# Patient Record
Sex: Female | Born: 1985 | Race: Black or African American | Hispanic: No | Marital: Single | Smoking: Former smoker
Health system: Southern US, Community
[De-identification: ages and names within clinical notes are randomized; demographics above are authoritative.]

## PROBLEM LIST (undated history)

## (undated) DIAGNOSIS — Z789 Other specified health status: Secondary | ICD-10-CM

---

## 2021-04-17 ENCOUNTER — Ambulatory Visit (HOSPITAL_COMMUNITY)
Admission: RE | Admit: 2021-04-17 | Discharge: 2021-04-17 | Disposition: A | Discharge status: Home / Self Care | Payer: PRIVATE HEALTH INSURANCE | Source: Ambulatory Visit

## 2021-04-17 ENCOUNTER — Encounter (HOSPITAL_COMMUNITY): Payer: Self-pay

## 2021-04-17 ENCOUNTER — Other Ambulatory Visit: Payer: Self-pay

## 2021-04-17 VITALS — BP 190/105 | HR 93 | Temp 99.0°F | Resp 18

## 2021-04-17 DIAGNOSIS — N63 Unspecified lump in unspecified breast: Secondary | ICD-10-CM

## 2021-04-17 NOTE — Discharge Instructions (Addendum)
Please follow up with breast center for imaging, they have been notified and should reach out to you, if you have not been called by Thursday please call them

## 2021-04-17 NOTE — ED Triage Notes (Signed)
Pt c/o a knot under rt nipple/breast area since Thursday. States the knot is getting larger and breast is sore. Denies any drainage.

## 2021-04-17 NOTE — ED Provider Notes (Addendum)
[facility]    CSN: [id number] Arrival date & time: 04/17/21  1749      History   Chief Complaint Chief Complaint  Patient presents with   Breast Pain   appt 6    HPI Ashley Bautista is a 35 y.o. female.   Patient presents with pain and hardness in right breast along the nipple beginning 4 days ago. Has worsened and feels "like its going to fall off". Has not occurred before. LMP around 5/25. Initially thought it was hormonal. No pain or discomfort in left breast. Denies discharge from nipple, cracking, dimpling or discoloration of the skin. Denies family history of breast cancer.   History reviewed. No pertinent past medical history.  There are no problems to display for this patient.   History reviewed. No pertinent surgical history.  OB History   No obstetric history on file.      Home Medications    Prior to Admission medications   Not on File    Family History History reviewed. No pertinent family history.  Social History Social History   Tobacco Use   Smoking status: Every Day    Pack years: 0.00    Types: Cigarettes   Smokeless tobacco: Never  Vaping Use   Vaping Use: Every day  Substance Use Topics   Alcohol use: Yes   Drug use: Yes    Types: Marijuana     Allergies   Patient has no known allergies.   Review of Systems Review of Systems Defer to HPI    Physical Exam Triage Vital Signs ED Triage Vitals [04/17/21 1822]  Enc Vitals Group     BP (!) 190/105     Pulse Rate 93     Resp 18     Temp 99 F (37.2 C)     Temp Source Oral     SpO2 98 %     Weight      Height      Head Circumference      Peak Flow      Pain Score 8     Pain Loc      Pain Edu?      Excl. in GC?    No data found.  Updated Vital Signs BP (!) 190/105 (BP Location: Left Arm)   Pulse 93   Temp 99 F (37.2 C) (Oral)   Resp 18   LMP 03/22/2021   SpO2 98%   Visual Acuity Right Eye Distance:   Left Eye Distance:   Bilateral Distance:     Right Eye Near:   Left Eye Near:    Bilateral Near:     Physical Exam Constitutional:      Appearance: Normal appearance. She is normal weight.  HENT:     Head: Normocephalic.  Eyes:     Extraocular Movements: Extraocular movements intact.  Pulmonary:     Effort: Pulmonary effort is normal.  Chest:  Breasts:    Right: Mass and tenderness present. No swelling, bleeding, inverted nipple, nipple discharge, skin change, axillary adenopathy or supraclavicular adenopathy.     Left: Normal.       Comments: Tenderness and non fluctuate mass felt between 4 to 5 o clock along right nipple  Musculoskeletal:     Cervical back: Normal range of motion.  Lymphadenopathy:     Upper Body:     Right upper body: No supraclavicular, axillary or pectoral adenopathy.  Neurological:     Mental Status: She is alert and oriented  to person, place, and time. Mental status is at baseline.  Psychiatric:        Mood and Affect: Mood normal.        Behavior: Behavior normal.     UC Treatments / Results  Labs (all labs ordered are listed, but only abnormal results are displayed) Labs Reviewed - No data to display  EKG   Radiology No results found.  Procedures Procedures (including critical care time)  Medications Ordered in UC Medications - No data to display  Initial Impression / Assessment and Plan / UC Course  I have reviewed the triage vital signs and the nursing notes.  Pertinent labs & imaging results that were available during my care of the patient were reviewed by me and considered in my medical decision making (see chart for details).  Nodule of skin of breast  1.referral placed for breast center for imaging, faxed over and given to patient. 2. [facility] [place] given to establish care  3. PCP referral placed Final Clinical Impressions(s) / UC Diagnoses   Final diagnoses:  Nodule of skin of breast     Discharge Instructions      Please follow  up with breast center for imaging, they have been notified and should reach out to you, if you have not been called by Thursday please call them   ED Prescriptions   None    PDMP not reviewed this encounter.   Valinda Hoar, NP 04/17/21 1858    Valinda Hoar, NP 04/17/21 [phone]

## 2021-04-20 ENCOUNTER — Other Ambulatory Visit: Payer: Self-pay | Admitting: Emergency Medicine

## 2021-04-20 DIAGNOSIS — N6310 Unspecified lump in the right breast, unspecified quadrant: Secondary | ICD-10-CM

## 2021-04-25 ENCOUNTER — Telehealth (HOSPITAL_BASED_OUTPATIENT_CLINIC_OR_DEPARTMENT_OTHER): Payer: Self-pay

## 2021-04-25 NOTE — Telephone Encounter (Signed)
LVMTCB re: scheduling a NP appt w/ PCP @ [facility]

## 2021-05-12 ENCOUNTER — Ambulatory Visit
Admission: RE | Admit: 2021-05-12 | Discharge: 2021-05-12 | Disposition: A | Discharge status: Home / Self Care | Payer: PRIVATE HEALTH INSURANCE | Source: Ambulatory Visit | Attending: Emergency Medicine | Admitting: Emergency Medicine

## 2021-05-12 ENCOUNTER — Other Ambulatory Visit: Payer: Self-pay | Admitting: Emergency Medicine

## 2021-05-12 ENCOUNTER — Other Ambulatory Visit: Payer: Self-pay

## 2021-05-12 DIAGNOSIS — N6310 Unspecified lump in the right breast, unspecified quadrant: Secondary | ICD-10-CM

## 2021-05-15 ENCOUNTER — Other Ambulatory Visit: Payer: Self-pay | Admitting: Emergency Medicine

## 2021-05-15 DIAGNOSIS — N611 Abscess of the breast and nipple: Secondary | ICD-10-CM

## 2021-05-15 LAB — AEROBIC/ANAEROBIC CULTURE W GRAM STAIN (SURGICAL/DEEP WOUND)
Culture: NORMAL
Special Requests: NORMAL

## 2021-05-31 ENCOUNTER — Ambulatory Visit
Admission: RE | Admit: 2021-05-31 | Discharge: 2021-05-31 | Disposition: A | Discharge status: Home / Self Care | Payer: PRIVATE HEALTH INSURANCE | Source: Ambulatory Visit | Attending: Emergency Medicine | Admitting: Emergency Medicine

## 2021-05-31 ENCOUNTER — Other Ambulatory Visit: Payer: Self-pay

## 2021-05-31 ENCOUNTER — Other Ambulatory Visit: Payer: Self-pay | Admitting: Emergency Medicine

## 2021-05-31 DIAGNOSIS — N611 Abscess of the breast and nipple: Secondary | ICD-10-CM

## 2021-06-14 ENCOUNTER — Ambulatory Visit
Admission: RE | Admit: 2021-06-14 | Discharge: 2021-06-14 | Disposition: A | Discharge status: Home / Self Care | Payer: PRIVATE HEALTH INSURANCE | Source: Ambulatory Visit | Attending: Emergency Medicine | Admitting: Emergency Medicine

## 2021-06-14 ENCOUNTER — Ambulatory Visit: Payer: PRIVATE HEALTH INSURANCE

## 2021-06-14 ENCOUNTER — Other Ambulatory Visit: Payer: Self-pay

## 2021-06-14 DIAGNOSIS — N611 Abscess of the breast and nipple: Secondary | ICD-10-CM

## 2021-08-28 ENCOUNTER — Telehealth: Payer: PRIVATE HEALTH INSURANCE

## 2021-08-31 ENCOUNTER — Encounter (HOSPITAL_COMMUNITY): Payer: Self-pay

## 2021-08-31 ENCOUNTER — Emergency Department (HOSPITAL_COMMUNITY)
Admission: EM | Admit: 2021-08-31 | Discharge: 2021-08-31 | Disposition: A | Discharge status: Home / Self Care | Payer: BC Managed Care – PPO | Attending: Emergency Medicine | Admitting: Emergency Medicine

## 2021-08-31 DIAGNOSIS — N61 Mastitis without abscess: Secondary | ICD-10-CM

## 2021-08-31 DIAGNOSIS — N644 Mastodynia: Secondary | ICD-10-CM | POA: Diagnosis present

## 2021-08-31 DIAGNOSIS — F17210 Nicotine dependence, cigarettes, uncomplicated: Secondary | ICD-10-CM | POA: Insufficient documentation

## 2021-08-31 DIAGNOSIS — N611 Abscess of the breast and nipple: Secondary | ICD-10-CM | POA: Diagnosis not present

## 2021-08-31 MED ORDER — OXYCODONE-ACETAMINOPHEN 5-325 MG PO TABS
1.0000 | ORAL_TABLET | Freq: Once | ORAL | Status: AC
  Administered 2021-08-31: 1 via ORAL
  Filled 2021-08-31: qty 1

## 2021-08-31 MED ORDER — LIDOCAINE-EPINEPHRINE-TETRACAINE (LET) TOPICAL GEL
3.0000 mL | Freq: Once | TOPICAL | Status: AC
  Administered 2021-08-31: 3 mL via TOPICAL
  Filled 2021-08-31: qty 3

## 2021-08-31 MED ORDER — LIDOCAINE HCL (PF) 1 % IJ SOLN
5.0000 mL | Freq: Once | INTRAMUSCULAR | Status: AC
  Administered 2021-08-31: 5 mL
  Filled 2021-08-31: qty 30

## 2021-08-31 MED ORDER — OXYCODONE-ACETAMINOPHEN 5-325 MG PO TABS
1.0000 | ORAL_TABLET | Freq: Three times a day (TID) | ORAL | 0 refills | Status: DC | PRN
Start: 2021-08-31 — End: 2021-10-09

## 2021-08-31 NOTE — ED Triage Notes (Signed)
Pt presents with c/o right breast swelling and pain. Pt's right breast is swollen and red. Pt reports a hx of same and reports she had to have the area drained. Pt reports the pain has been increasing and it is now causing shortness of breath and chest tightness.

## 2021-08-31 NOTE — Discharge Instructions (Addendum)
Continue taking antibiotics as prescribed.  Use warm compresses, 20 minutes at a time, 3 times a day. Use Tylenol or ibuprofen as needed for mild to moderate pain.  Use Percocet as needed for severe breakthrough pain.  Have caution, this make you tired or groggy.  Do not drive or operate heavy machinery while taking this medicine. Follow-up with the general surgery group for further management of your breast abscess.  Call the number listed below to set up a follow-up appointment for Monday afternoon.  Return with any new, worsening, concerning symptoms.

## 2021-08-31 NOTE — ED Notes (Signed)
Pt endorses pain when taking deep breaths. Pt states it feels like a "burning sensation."

## 2021-08-31 NOTE — ED Provider Notes (Signed)
[facility]-EMERGENCY DEPT Provider Note   CSN: [id number] Arrival date & time: 08/31/21  1154     History Chief Complaint  Patient presents with   Breast Pain   Shortness of Breath    Ashley Bautista is a 35 y.o. female presenting for evaluation of breast pain.  Patient states for the past 10 days she has had increasing pain of her right breast.  Initially it was red and hot, however in the past 2 days it has become much more swollen and painful.  She saw her PCP 2 days ago, was started on Bactrim.  She is been taking antibiotics as prescribed, but symptoms have been worsening.  She is been taking ibuprofen without improvement of symptoms.  She is not taking anything else for pain.  She had infection in the same spot of her breast a few months ago, drained at the breast center.  She called the birth center, but they were unable to get her an appointment for several months.  She denies fevers, chills, nausea or vomiting.  She has no other medical problems, takes no other medications daily.  HPI     History reviewed. No pertinent past medical history.  There are no problems to display for this patient.   History reviewed. No pertinent surgical history.   OB History   No obstetric history on file.     History reviewed. No pertinent family history.  Social History   Tobacco Use   Smoking status: Every Day    Types: Cigarettes   Smokeless tobacco: Never  Vaping Use   Vaping Use: Every day  Substance Use Topics   Alcohol use: Yes   Drug use: Yes    Types: Marijuana    Home Medications Prior to Admission medications   Not on File    Allergies    Patient has no known allergies.  Review of Systems   Review of Systems  Skin:        R breast pain, redness, swelling  All other systems reviewed and are negative.  Physical Exam Updated Vital Signs BP (!) 129/109   Pulse 69   Temp 98.3 F (36.8 C) (Oral)   Resp 16   Ht 5\' 6"  (1.676 m)   Wt  69.9 kg   SpO2 100%   BMI 24.86 kg/m   Physical Exam Vitals and nursing note reviewed. Exam conducted with a chaperone present.  Constitutional:      General: She is not in acute distress.    Appearance: Normal appearance.  HENT:     Head: Normocephalic and atraumatic.  Eyes:     Conjunctiva/sclera: Conjunctivae normal.     Pupils: Pupils are equal, round, and reactive to light.  Cardiovascular:     Rate and Rhythm: Normal rate and regular rhythm.     Pulses: Normal pulses.  Pulmonary:     Effort: Pulmonary effort is normal. No respiratory distress.     Breath sounds: Normal breath sounds. No wheezing.     Comments: Speaking in full sentences.  Clear lung sounds in all fields. Chest:       Comments: Blood fluctuant abscess with surrounding skin erythema and induration. No active drainage. No drainage from the nipple.  Abdominal:     General: There is no distension.     Palpations: Abdomen is soft.     Tenderness: There is no abdominal tenderness.  Musculoskeletal:        General: Normal range of motion.  Cervical back: Normal range of motion and neck supple.  Skin:    General: Skin is warm and dry.     Capillary Refill: Capillary refill takes less than 2 seconds.  Neurological:     Mental Status: She is alert and oriented to person, place, and time.  Psychiatric:        Mood and Affect: Mood and affect normal.        Speech: Speech normal.        Behavior: Behavior normal.    ED Results / Procedures / Treatments   Labs (all labs ordered are listed, but only abnormal results are displayed) Labs Reviewed - No data to display  EKG None  Radiology No results found.  Procedures .Marland KitchenIncision and Drainage  Date/Time: 08/31/2021 7:38 PM Performed by: Alveria Apley, PA-C Authorized by: Alveria Apley, PA-C   Consent:    Consent obtained:  Verbal   Consent given by:  Patient   Risks discussed:  Bleeding, incomplete drainage, pain and infection Location:     Type:  Abscess   Location:  Trunk   Trunk location:  R breast Pre-procedure details:    Skin preparation:  Antiseptic wash Anesthesia:    Anesthesia method:  Topical application and local infiltration   Topical anesthetic:  LET   Local anesthetic:  Lidocaine 1% w/o epi Procedure type:    Complexity:  Complex Procedure details:    Incision types:  Single straight   Incision depth:  Dermal   Wound management:  Probed and deloculated and irrigated with saline   Drainage:  Purulent   Drainage amount:  Copious   Packing materials:  1/4 in iodoform gauze Post-procedure details:    Procedure completion:  Tolerated well, no immediate complications   Medications Ordered in ED Medications  oxyCODONE-acetaminophen (PERCOCET/ROXICET) 5-325 MG per tablet 1 tablet (has no administration in time range)  lidocaine-EPINEPHrine-tetracaine (LET) topical gel (has no administration in time range)  lidocaine (PF) (XYLOCAINE) 1 % injection 5 mL (has no administration in time range)    ED Course  I have reviewed the triage vital signs and the nursing notes.  Pertinent labs & imaging results that were available during my care of the patient were reviewed by me and considered in my medical decision making (see chart for details).    MDM Rules/Calculators/A&P                           Patient presented for evaluation of breast pain.  On exam, patient appears uncomfortable due to pain, but otherwise nontoxic.  She has a large breast abscess with surrounding indurated cellulitic skin.  There is no active drainage.  Vital signs are reassuring, not consistent with sepsis.  Abscess is fairly superficial at this time.  Due to the amount of fluctuance and superficial nature, I do feel I&D is an option at this time.  However I discussed risks of poor healing and scarring.  Patient like to proceed with I&D.  I&D performed as described above.  Copious purulence expressed.  Packed with iodoform gauze.    Arrange follow-up with [facility] on Monday afternoon for packing removal and recheck.  Encourage patient continue taking antibiotics, she is on Bactrim.  Discussed wound care and use of warm compresses.  At this time, patient appears safe for discharge.  Return precautions given.  Patient states she understands and agrees to plan  Final Clinical Impression(s) / ED Diagnoses Final diagnoses:  None  Rx / DC Orders ED Discharge Orders     None        Alveria Apley, PA-C 08/31/21 1941    Pollyann Savoy, MD 08/31/21 [phone]

## 2021-10-03 ENCOUNTER — Ambulatory Visit (HOSPITAL_COMMUNITY)
Admission: EM | Admit: 2021-10-03 | Discharge: 2021-10-03 | Disposition: A | Discharge status: Other Acute Inpatient Hospital | Payer: BC Managed Care – PPO | Attending: Psychiatry | Admitting: Psychiatry

## 2021-10-03 ENCOUNTER — Other Ambulatory Visit: Payer: Self-pay | Admitting: Registered Nurse

## 2021-10-03 ENCOUNTER — Encounter (HOSPITAL_COMMUNITY): Payer: Self-pay | Admitting: Emergency Medicine

## 2021-10-03 DIAGNOSIS — F17210 Nicotine dependence, cigarettes, uncomplicated: Secondary | ICD-10-CM | POA: Insufficient documentation

## 2021-10-03 DIAGNOSIS — R45851 Suicidal ideations: Secondary | ICD-10-CM | POA: Diagnosis present

## 2021-10-03 DIAGNOSIS — F332 Major depressive disorder, recurrent severe without psychotic features: Secondary | ICD-10-CM | POA: Insufficient documentation

## 2021-10-03 DIAGNOSIS — Z20822 Contact with and (suspected) exposure to covid-19: Secondary | ICD-10-CM | POA: Diagnosis not present

## 2021-10-03 DIAGNOSIS — Z634 Disappearance and death of family member: Secondary | ICD-10-CM | POA: Diagnosis not present

## 2021-10-03 DIAGNOSIS — Z9151 Personal history of suicidal behavior: Secondary | ICD-10-CM | POA: Diagnosis not present

## 2021-10-03 LAB — ETHANOL: Alcohol, Ethyl (B): 217 mg/dL — ABNORMAL HIGH (ref <10)

## 2021-10-03 LAB — HIV ANTIBODY (ROUTINE TESTING W REFLEX): HIV Screen 4th Generation wRfx: NONREACTIVE

## 2021-10-03 LAB — POCT URINE DRUG SCREEN - MANUAL ENTRY (I-SCREEN)
POC Amphetamine UR: NOT DETECTED
POC Buprenorphine (BUP): NOT DETECTED
POC Cocaine UR: NOT DETECTED
POC Marijuana UR: POSITIVE — AB
POC Methadone UR: NOT DETECTED
POC Methamphetamine UR: NOT DETECTED
POC Morphine: NOT DETECTED
POC Oxazepam (BZO): NOT DETECTED
POC Oxycodone UR: NOT DETECTED
POC Secobarbital (BAR): NOT DETECTED

## 2021-10-03 LAB — URINALYSIS, COMPLETE (UACMP) WITH MICROSCOPIC
Bacteria, UA: NONE SEEN
Bilirubin Urine: NEGATIVE
Glucose, UA: NEGATIVE mg/dL
Hgb urine dipstick: NEGATIVE
Ketones, ur: 15 mg/dL — AB
Leukocytes,Ua: NEGATIVE
Nitrite: NEGATIVE
Protein, ur: NEGATIVE mg/dL
Specific Gravity, Urine: >1.03 — ABNORMAL HIGH (ref 1.005–1.030)
pH: 5.5 (ref 5.0–8.0)

## 2021-10-03 LAB — CBC WITH DIFFERENTIAL/PLATELET
Abs Immature Granulocytes: 0.04 10*3/uL (ref 0.00–0.07)
Basophils Absolute: 0 10*3/uL (ref 0.0–0.1)
Basophils Relative: 0 %
Eosinophils Absolute: 0 10*3/uL (ref 0.0–0.5)
Eosinophils Relative: 0 %
HCT: 44.7 % (ref 36.0–46.0)
Hemoglobin: 15.1 g/dL — ABNORMAL HIGH (ref 12.0–15.0)
Immature Granulocytes: 1 %
Lymphocytes Relative: 19 %
Lymphs Abs: 1.2 10*3/uL (ref 0.7–4.0)
MCH: 32.5 pg (ref 26.0–34.0)
MCHC: 33.8 g/dL (ref 30.0–36.0)
MCV: 96.1 fL (ref 80.0–100.0)
Monocytes Absolute: 0.7 10*3/uL (ref 0.1–1.0)
Monocytes Relative: 11 %
Neutro Abs: 4.5 10*3/uL (ref 1.7–7.7)
Neutrophils Relative %: 69 %
Platelets: 264 10*3/uL (ref 150–400)
RBC: 4.65 MIL/uL (ref 3.87–5.11)
RDW: 13.6 % (ref 11.5–15.5)
WBC: 6.4 10*3/uL (ref 4.0–10.5)
nRBC: 0 % (ref 0.0–0.2)

## 2021-10-03 LAB — COMPREHENSIVE METABOLIC PANEL
ALT: 24 U/L (ref 0–44)
AST: 48 U/L — ABNORMAL HIGH (ref 15–41)
Albumin: 3.9 g/dL (ref 3.5–5.0)
Alkaline Phosphatase: 41 U/L (ref 38–126)
Anion gap: 13 (ref 5–15)
BUN: 10 mg/dL (ref 6–20)
CO2: 20 mmol/L — ABNORMAL LOW (ref 22–32)
Calcium: 8.6 mg/dL — ABNORMAL LOW (ref 8.9–10.3)
Chloride: 105 mmol/L (ref 98–111)
Creatinine, Ser: 0.69 mg/dL (ref 0.44–1.00)
GFR, Estimated: >60 mL/min (ref >60)
Glucose, Bld: 98 mg/dL (ref 70–99)
Potassium: 3.5 mmol/L (ref 3.5–5.1)
Sodium: 138 mmol/L (ref 135–145)
Total Bilirubin: 0.9 mg/dL (ref 0.3–1.2)
Total Protein: 6.3 g/dL — ABNORMAL LOW (ref 6.5–8.1)

## 2021-10-03 LAB — POCT PREGNANCY, URINE: Preg Test, Ur: NEGATIVE

## 2021-10-03 LAB — LIPID PANEL
Cholesterol: 146 mg/dL (ref 0–200)
HDL: 92 mg/dL (ref >40)
LDL Cholesterol: 43 mg/dL (ref 0–99)
Total CHOL/HDL Ratio: 1.6 RATIO
Triglycerides: 53 mg/dL (ref <150)
VLDL: 11 mg/dL (ref 0–40)

## 2021-10-03 LAB — MAGNESIUM: Magnesium: 1.7 mg/dL (ref 1.7–2.4)

## 2021-10-03 LAB — HEMOGLOBIN A1C
Hgb A1c MFr Bld: 4.7 % — ABNORMAL LOW (ref 4.8–5.6)
Mean Plasma Glucose: 88.19 mg/dL

## 2021-10-03 LAB — RESP PANEL BY RT-PCR (FLU A&B, COVID) ARPGX2
Influenza A by PCR: NEGATIVE
Influenza B by PCR: NEGATIVE
SARS Coronavirus 2 by RT PCR: NEGATIVE

## 2021-10-03 LAB — POC SARS CORONAVIRUS 2 AG -  ED: SARS Coronavirus 2 Ag: NEGATIVE

## 2021-10-03 LAB — PREGNANCY, URINE: Preg Test, Ur: NEGATIVE

## 2021-10-03 MED ORDER — MAGNESIUM HYDROXIDE 400 MG/5ML PO SUSP: 30.0000 mL | Freq: Every day | ORAL | Status: DC | PRN

## 2021-10-03 MED ORDER — ACETAMINOPHEN 325 MG PO TABS: 650.0000 mg | ORAL_TABLET | Freq: Four times a day (QID) | ORAL | Status: DC | PRN

## 2021-10-03 MED ORDER — ALUM & MAG HYDROXIDE-SIMETH 200-200-20 MG/5ML PO SUSP: 30.0000 mL | ORAL | Status: DC | PRN

## 2021-10-03 MED ORDER — HYDROXYZINE HCL 25 MG PO TABS
25.0000 mg | ORAL_TABLET | Freq: Three times a day (TID) | ORAL | Status: DC | PRN
  Administered 2021-10-03: 25 mg via ORAL
  Filled 2021-10-03: qty 1

## 2021-10-03 MED ORDER — TRAZODONE HCL 50 MG PO TABS
50.0000 mg | ORAL_TABLET | Freq: Every evening | ORAL | Status: DC | PRN
  Administered 2021-10-03: 50 mg via ORAL
  Filled 2021-10-03: qty 1

## 2021-10-03 NOTE — ED Notes (Signed)
Pt sleeping@this time. Breathing even and unlabored. Will continue to monitor for safety 

## 2021-10-03 NOTE — ED Provider Notes (Signed)
[facility]/OBS ASAP Discharge Summary  Date and Time: 10/03/2021 9:03 PM  Name: Ashley Bautista  MRN:  [id number]   Discharge Diagnoses:  Final diagnoses:  Severe episode of recurrent major depressive disorder, without psychotic features (HCC)   Transferred to [facility] for inpatient psychiatric treatment  Admission HPI: Ashley Bautista, 35 y.o., female patient seen face to face by this provider, consulted with Dr. Bronwen Betters; and chart reviewed on 10/03/21.   Patient presented to [facility] as a walk in voluntarily  accompanied by her mother and sister.   On evaluation Ashley Bautista reports in April she moved with her partner of two years from [place] to [place] to be closer to her family. She was living with her partner, they had an argument yesterday and she has not seen her partner since. She has had depression and anxiety for while but states the past month she has began having suicidal ideations. She has been unable to work this week. She has been trying to cope on her own, but  today her Mother and sister was worried and brought her in for an assessment.She has no out patient services in place and does not take any medications.  Reports a psychiatric history of bipolar disorder, anxiety and depression.  She has a history of 1 suicide attempt by overdose with 1 inpatient psychiatric admission in [place] in 2012.  Patient also endorses drinking alcohol daily for the past 4 to 5 years.  She drinks 1/5 of brandy.  States she takes about 5 shots before she goes to work each day.  She endorses daily marijuana use (1 blunt).  Reports that she does have gaps with sobriety.  Denies any seizures or delirium tremors when withdrawing from alcohol.  Denies any withdrawal symptoms at this time.she is employed full-time as a Location manager.   During evaluation Ashley Bautista is in sitting position in no acute distress.  She makes fair eye contact.  She is fairly groomed.  She is alert/oriented x4 and cooperative.  She is  speaking at a moderate volume and normal pace.  Reports increased depression and anxiety for the past month.  Reports the anniversary of her father's death is in 08-Oct-2023 and his birthday is in the December.  States these months are extremely difficult for her.  Reports feelings of worthlessness, helplessness, and hopelessness.  Endorses endorses a decrease in energy and inability to focus.  Endorses broken sleep and a decrease in appetite.  She is unsure if she has had weight loss. She endorses racing thoughts about her relationship.  There is no indication that she is currently responding to internal/external stimuli or experiencing delusional thought content. She denies AVH and HI.  She denies delusion normal and paranoid thought.  She endorses suicidal ideations with a plan to overdose.  Patient cannot contract for safety she endorses passive homicidal ideations towards her partner.  Denies having any intent, plan, or access to means.   Discussed inpatient psychiatric treatment with patient.  Patient was initially hesitant.  Sister was allowed to come back to the assessment room .  After spending time with her sister patient agreed to the admission.   Collateral: Mother and sister Toni Amend) with patient's permission.  Mother states they have been concerned about patient for the past week.  States today she went to the patient's home the patient told her to take her animal.  Reports patient told her "I am ready to go".  Reports patient argued and demanded for her to leave so  she could take care of things.  Mother refused to leave and stay with patient today until patient agreed to come in for assessment.  Mother and sister believe patient is a danger to herself.  They believe patient will overdose if she is discharged at this time.  Past Medical History: No past medical history on file. No past surgical history on file. Family History: No family history on file.  Social History:  Social History    Substance and Sexual Activity  Alcohol Use Yes     Social History   Substance and Sexual Activity  Drug Use Yes   Types: Marijuana    Social History   Socioeconomic History   Marital status: Single    Spouse name: Not on file   Number of children: Not on file   Years of education: Not on file   Highest education level: Not on file  Occupational History   Not on file  Tobacco Use   Smoking status: Every Day    Types: Cigarettes   Smokeless tobacco: Never  Vaping Use   Vaping Use: Every day  Substance and Sexual Activity   Alcohol use: Yes   Drug use: Yes    Types: Marijuana   Sexual activity: Not on file  Other Topics Concern   Not on file  Social History Narrative   Not on file   Social Determinants of Health   Financial Resource Strain: Not on file  Food Insecurity: Not on file  Transportation Needs: Not on file  Physical Activity: Not on file  Stress: Not on file  Social Connections: Not on file   SDOH:  SDOH Screenings   Alcohol Screen: Not on file  Depression (PHQ2-9): Not on file  Financial Resource Strain: Not on file  Food Insecurity: Not on file  Housing: Not on file  Physical Activity: Not on file  Social Connections: Not on file  Stress: Not on file  Tobacco Use: High Risk   Smoking Tobacco Use: Every Day   Smokeless Tobacco Use: Never   Passive Exposure: Not on file  Transportation Needs: Not on file    Tobacco Cessation:  Prescription not provided because: transferred to inpatient facility  Current Medications:  Current Facility-Administered Medications  Medication Dose Route Frequency Provider Last Rate Last Admin   acetaminophen (TYLENOL) tablet 650 mg  650 mg Oral Q6H PRN Ardis Hughs, NP       alum & mag hydroxide-simeth (MAALOX/MYLANTA) 200-200-20 MG/5ML suspension 30 mL  30 mL Oral Q4H PRN Ardis Hughs, NP       hydrOXYzine (ATARAX) tablet 25 mg  25 mg Oral TID PRN Ardis Hughs, NP       magnesium hydroxide  (MILK OF MAGNESIA) suspension 30 mL  30 mL Oral Daily PRN Ardis Hughs, NP       traZODone (DESYREL) tablet 50 mg  50 mg Oral QHS PRN Ardis Hughs, NP       Current Outpatient Medications  Medication Sig Dispense Refill   ibuprofen (ADVIL) 200 MG tablet Take 200 mg by mouth every 6 (six) hours as needed for moderate pain, mild pain or headache.     oxyCODONE-acetaminophen (PERCOCET/ROXICET) 5-325 MG tablet Take 1 tablet by mouth every 8 (eight) hours as needed for severe pain. 8 tablet 0   sulfamethoxazole-trimethoprim (BACTRIM DS) 800-160 MG tablet Take 1 tablet by mouth 2 (two) times daily.      PTA Medications: (Not in a hospital admission)  Musculoskeletal  Strength & Muscle Tone: within normal limits Gait & Station: normal Patient leans: N/A  Psychiatric Specialty Exam  Presentation  General Appearance: Casual; Appropriate for Environment  Eye Contact:Fair  Speech:Clear and Coherent; Normal Rate  Speech Volume:Normal  Handedness:No data recorded  Mood and Affect  Mood:Anxious; Depressed  Affect:Tearful; Congruent; Depressed   Thought Process  Thought Processes:Coherent  Descriptions of Associations:Intact  Orientation:Full (Time, Place and Person)  Thought Content:Logical  Diagnosis of Schizophrenia or Schizoaffective disorder in past: No    Hallucinations:Hallucinations: None  Ideas of Reference:None  Suicidal Thoughts:Suicidal Thoughts: Yes, Active SI Active Intent and/or Plan: With Intent; With Plan; With Means to Carry Out  Homicidal Thoughts:Homicidal Thoughts: Yes, Passive HI Passive Intent and/or Plan: Without Intent; Without Plan; Without Means to Carry Out   Sensorium  Memory:Immediate Good; Recent Good; Remote Good  Judgment:Poor  Insight:Fair   Executive Functions  Concentration:Good  Attention Span:Good  Recall:Good  Fund of Knowledge:Good  Language:Good   Psychomotor Activity  Psychomotor Activity:Psychomotor  Activity: Normal   Assets  Assets:Communication Skills; Desire for Improvement; Financial Resources/Insurance; Housing; Leisure Time; Physical Health; Social Support; English as a second language teacher; Vocational/Educational   Sleep  Sleep:Sleep: Poor Number of Hours of Sleep: 2   Nutritional Assessment (For OBS and FBC admissions only) Has the patient had a weight loss or gain of 10 pounds or more in the last 3 months?: No Has the patient had a decrease in food intake/or appetite?: Yes Does the patient have dental problems?: No Does the patient have eating habits or behaviors that may be indicators of an eating disorder including binging or inducing vomiting?: No Has the patient recently lost weight without trying?: 0 Has the patient been eating poorly because of a decreased appetite?: 1 Malnutrition Screening Tool Score: 1    Physical Exam  Physical Exam Constitutional:      General: She is not in acute distress.    Appearance: She is not ill-appearing, toxic-appearing or diaphoretic.  Pulmonary:     Effort: Pulmonary effort is normal. No respiratory distress.  Musculoskeletal:        General: Normal range of motion.  Neurological:     Mental Status: She is alert and oriented to person, place, and time.  Psychiatric:        Mood and Affect: Mood is anxious and depressed.        Thought Content: Thought content is not paranoid or delusional. Thought content includes suicidal ideation. Thought content does not include homicidal ideation. Thought content includes suicidal plan.   Review of Systems  Respiratory:  Negative for cough and shortness of breath.   Cardiovascular:  Negative for chest pain.  Gastrointestinal:  Negative for diarrhea, nausea and vomiting.   Blood pressure 131/81, pulse 74, temperature 99 F (37.2 C), temperature source Oral, resp. rate 16, SpO2 100 %. There is no height or weight on file to calculate BMI.    Disposition: Transferred to [facility] for inpatient  psychiatric treatment  Jackelyn Poling, NP 10/03/2021, 9:03 PM

## 2021-10-03 NOTE — ED Notes (Signed)
Pt calm and cooperative. No c/o pain or distress. A&O x4. Will continue to monitor pt for safety

## 2021-10-03 NOTE — Discharge Instructions (Addendum)
Transfer patient to [facility] for inpatient psychiatric admission.

## 2021-10-03 NOTE — Progress Notes (Signed)
Pt was accepted to [facility] 10/03/2021 at 2300 PENDING Negative COVID-19; Bed Assignment 402-2.  Pt meets inpatient criteria per Fatima Sanger, NP  Attending Physician will be Dr. Sherron Flemings  Report can be called to: Adult unit: [phone]  Pt can arrive after 2300  Care team notified via secure chat: Jessie Foot, RN, Bedelia Person, RN, Rodney Langton, LPN, [facility] Rona Ravens, RN, Vernard Gambles, NP, and Hansel Starling, RN.  Voluntary can be sent to [phone].  Kelton Pillar, LCSWA 10/03/2021 @ 7:05 PM

## 2021-10-03 NOTE — ED Notes (Signed)
Pt awake and alert x4.  Flat sad affect. States that " this time of year is always hard for me.  My Father died the day after Christmas and I get depressed around this time".  Pt states that she had the thought of overdosing on " all the pills I have in the house like I did one time before".  Pt searched prior to admission to [facility]  Skin assessment performed by Ryta RN.  Pt oriented to Unit and given meal tray.  No further distress noted. Will monitor for safety.

## 2021-10-03 NOTE — BH Assessment (Signed)
Ashley Bautista SI with plan to OD, ETOH and THC use daily. Recent argument with partner and has not heard from her in 24 hours. Pt is emergent

## 2021-10-03 NOTE — ED Provider Notes (Signed)
Behavioral Health Admission H&P [facility] & OBS)  Date: 10/03/21 Patient Name: Ashley Bautista MRN: [id number] Chief Complaint:  Chief Complaint  Patient presents with   Suicidal      Diagnoses:  Final diagnoses:  Severe episode of recurrent major depressive disorder, without psychotic features [facility])    HPI: Ashley Bautista, 35 y.o., female patient seen face to face by this provider, consulted with Dr. Bronwen Betters; and chart reviewed on 10/03/21.   Patient presented to [facility] as a walk in voluntarily  accompanied by her mother and sister.  On evaluation Ashley Bautista reports in April she moved with her partner of two years from [place] to [place] to be closer to her family. She was living with her partner, they had an argument yesterday and she has not seen her partner since. She has had depression and anxiety for while but states the past month she has began having suicidal ideations. She has been unable to work this week. She has been trying to cope on her own, but  today her Mother and sister was worried and brought her in for an assessment.She has no out patient services in place and does not take any medications.  Reports a psychiatric history of bipolar disorder, anxiety and depression.  She has a history of 1 suicide attempt by overdose with 1 inpatient psychiatric admission in [place] in 2012.  Patient also endorses drinking alcohol daily for the past 4 to 5 years.  She drinks 1/5 of brandy.  States she takes about 5 shots before she goes to work each day.  She endorses daily marijuana use (1 blunt).  Reports that she does have gaps with sobriety.  Denies any seizures or delirium tremors when withdrawing from alcohol.  Denies any withdrawal symptoms at this time.she is employed full-time as a Location manager.  During evaluation Ashley Bautista is in sitting position in no acute distress.  She makes fair eye contact.  She is fairly groomed.  She is alert/oriented x4 and cooperative.  She is speaking  at a moderate volume and normal pace.  Reports increased depression and anxiety for the past month.  Reports the anniversary of her father's death is in Oct 02, 2023 and his birthday is in the December.  States these months are extremely difficult for her.  Reports feelings of worthlessness, helplessness, and hopelessness.  Endorses endorses a decrease in energy and inability to focus.  Endorses broken sleep and a decrease in appetite.  She is unsure if she has had weight loss. She endorses racing thoughts about her relationship.  There is no indication that she is currently responding to internal/external stimuli or experiencing delusional thought content. She denies AVH and HI.  She denies delusion normal and paranoid thought.  She endorses suicidal ideations with a plan to overdose.  Patient cannot contract for safety she endorses passive homicidal ideations towards her partner.  Denies having any intent, plan, or access to means.  Discussed inpatient psychiatric treatment with patient.  Patient was initially hesitant.  Sister was allowed to come back to the assessment room .  After spending time with her sister patient agreed to the admission.  Collateral: Mother and sister Ashley Bautista) with patient's permission.  Mother states they have been concerned about patient for the past week.  States today she went to the patient's home the patient told her to take her animal.  Reports patient told her "I am ready to go".  Reports patient argued and demanded for her to leave so she could  take care of things.  Mother refused to leave and stay with patient today until patient agreed to come in for assessment.  Mother and sister believe patient is a danger to herself.  They believe patient will overdose if she is discharged at this time.      PHQ 2-9:   Flowsheet Row ED from 10/03/2021 in [facility] ED from 08/31/2021 in [facility] DEPT ED from 04/17/2021 in [facility]  Health Urgent Care at [facility] RISK CATEGORY High Risk No Risk No Risk        Total Time spent with patient: 45 minutes  Musculoskeletal  Strength & Muscle Tone: within normal limits Gait & Station: normal Patient leans: N/A  Psychiatric Specialty Exam  Presentation General Appearance: Casual; Appropriate for Environment  Eye Contact:Fair  Speech:Clear and Coherent; Normal Rate  Speech Volume:Normal  Handedness:No data recorded  Mood and Affect  Mood:Anxious; Depressed  Affect:Tearful; Congruent; Depressed   Thought Process  Thought Processes:Coherent  Descriptions of Associations:Intact  Orientation:Full (Time, Place and Person)  Thought Content:Logical    Hallucinations:Hallucinations: None  Ideas of Reference:None  Suicidal Thoughts:Suicidal Thoughts: Yes, Active SI Active Intent and/or Plan: With Intent; With Plan; With Means to Carry Out  Homicidal Thoughts:Homicidal Thoughts: Yes, Passive HI Passive Intent and/or Plan: Without Intent; Without Plan; Without Means to Carry Out   Sensorium  Memory:Immediate Good; Recent Good; Remote Good  Judgment:Poor  Insight:Fair   Executive Functions  Concentration:Good  Attention Span:Good  Recall:Good  Fund of Knowledge:Good  Language:Good   Psychomotor Activity  Psychomotor Activity:Psychomotor Activity: Normal   Assets  Assets:Communication Skills; Desire for Improvement; Financial Resources/Insurance; Housing; Leisure Time; Physical Health; Social Support; English as a second language teacher; Vocational/Educational   Sleep  Sleep:Sleep: Poor Number of Hours of Sleep: 2   Nutritional Assessment (For OBS and FBC admissions only) Has the patient had a weight loss or gain of 10 pounds or more in the last 3 months?: No Has the patient had a decrease in food intake/or appetite?: Yes Does the patient have dental problems?: No Does the patient have eating habits or behaviors that may be indicators of an  eating disorder including binging or inducing vomiting?: No Has the patient recently lost weight without trying?: 0 Has the patient been eating poorly because of a decreased appetite?: 1 Malnutrition Screening Tool Score: 1   Physical Exam Vitals and nursing note reviewed.  Constitutional:      General: She is not in acute distress.    Appearance: Normal appearance. She is not ill-appearing.  HENT:     Head: Normocephalic.  Eyes:     General:        Right eye: No discharge.        Left eye: No discharge.     Conjunctiva/sclera: Conjunctivae normal.  Cardiovascular:     Rate and Rhythm: Normal rate.  Pulmonary:     Effort: Pulmonary effort is normal.  Musculoskeletal:        General: Normal range of motion.     Cervical back: Normal range of motion.  Skin:    General: Skin is warm.     Coloration: Skin is not jaundiced or pale.  Neurological:     Mental Status: She is alert and oriented to person, place, and time.  Psychiatric:        Attention and Perception: Attention and perception normal.        Mood and Affect: Mood is anxious and depressed.  Speech: Speech normal.        Behavior: Behavior normal. Behavior is cooperative.        Thought Content: Thought content includes homicidal and suicidal ideation. Thought content includes suicidal plan. Thought content does not include homicidal plan.        Cognition and Memory: Cognition normal.        Judgment: Judgment is impulsive.   Review of Systems  Constitutional: Negative.   HENT: Negative.    Eyes: Negative.   Respiratory: Negative.    Cardiovascular: Negative.   Gastrointestinal: Negative.   Musculoskeletal: Negative.   Skin: Negative.   Neurological: Negative.   Psychiatric/Behavioral:  Positive for depression, substance abuse and suicidal ideas. The patient is nervous/anxious.    Blood pressure 131/81, pulse 74, temperature 99 F (37.2 C), temperature source Oral, resp. rate 16, SpO2 100 %. There is no  height or weight on file to calculate BMI.  Past Psychiatric History: Bipolar disorder and depression  Is the patient at risk to self? Yes  Has the patient been a risk to self in the past 6 months? Yes .    Has the patient been a risk to self within the distant past? Yes   Is the patient a risk to others? Yes   Has the patient been a risk to others in the past 6 months? No   Has the patient been a risk to others within the distant past? No   Past Medical History: No past medical history on file. No past surgical history on file.  Family History: No family history on file.  Social History:  Social History   Socioeconomic History   Marital status: Single    Spouse name: Not on file   Number of children: Not on file   Years of education: Not on file   Highest education level: Not on file  Occupational History   Not on file  Tobacco Use   Smoking status: Every Day    Types: Cigarettes   Smokeless tobacco: Never  Vaping Use   Vaping Use: Every day  Substance and Sexual Activity   Alcohol use: Yes   Drug use: Yes    Types: Marijuana   Sexual activity: Not on file  Other Topics Concern   Not on file  Social History Narrative   Not on file   Social Determinants of Health   Financial Resource Strain: Not on file  Food Insecurity: Not on file  Transportation Needs: Not on file  Physical Activity: Not on file  Stress: Not on file  Social Connections: Not on file  Intimate Partner Violence: Not on file    SDOH:  SDOH Screenings   Alcohol Screen: Not on file  Depression (PHQ2-9): Not on file  Financial Resource Strain: Not on file  Food Insecurity: Not on file  Housing: Not on file  Physical Activity: Not on file  Social Connections: Not on file  Stress: Not on file  Tobacco Use: High Risk   Smoking Tobacco Use: Every Day   Smokeless Tobacco Use: Never   Passive Exposure: Not on file  Transportation Needs: Not on file    Last Labs:  Admission on 10/03/2021   Component Date Value Ref Range Status   SARS Coronavirus 2 by RT PCR 10/03/2021 NEGATIVE  NEGATIVE Final   Comment: (NOTE) SARS-CoV-2 target nucleic acids are NOT DETECTED.  The SARS-CoV-2 RNA is generally detectable in upper respiratory specimens during the acute phase of infection. The lowest concentration of  SARS-CoV-2 viral copies this assay can detect is 138 copies/mL. A negative result does not preclude SARS-Cov-2 infection and should not be used as the sole basis for treatment or other patient management decisions. A negative result may occur with  improper specimen collection/handling, submission of specimen other than nasopharyngeal swab, presence of viral mutation(s) within the areas targeted by this assay, and inadequate number of viral copies(<138 copies/mL). A negative result must be combined with clinical observations, patient history, and epidemiological information. The expected result is Negative.  Fact Sheet for Patients:  BloggerCourse.com  Fact Sheet for Healthcare Providers:  SeriousBroker.it  This test is no                          t yet approved or cleared by the [place] FDA and  has been authorized for detection and/or diagnosis of SARS-CoV-2 by FDA under an Emergency Use Authorization (EUA). This EUA will remain  in effect (meaning this test can be used) for the duration of the COVID-19 declaration under Section 564(b)(1) of the Act, 21 U.S.C.section 360bbb-3(b)(1), unless the authorization is terminated  or revoked sooner.       Influenza A by PCR 10/03/2021 NEGATIVE  NEGATIVE Final   Influenza B by PCR 10/03/2021 NEGATIVE  NEGATIVE Final   Comment: (NOTE) The Xpert Xpress SARS-CoV-2/FLU/RSV plus assay is intended as an aid in the diagnosis of influenza from Nasopharyngeal swab specimens and should not be used as a sole basis for treatment. Nasal washings and aspirates are unacceptable for  Xpert Xpress SARS-CoV-2/FLU/RSV testing.  Fact Sheet for Patients: BloggerCourse.com  Fact Sheet for Healthcare Providers: SeriousBroker.it  This test is not yet approved or cleared by the [place] FDA and has been authorized for detection and/or diagnosis of SARS-CoV-2 by FDA under an Emergency Use Authorization (EUA). This EUA will remain in effect (meaning this test can be used) for the duration of the COVID-19 declaration under Section 564(b)(1) of the Act, 21 U.S.C. section 360bbb-3(b)(1), unless the authorization is terminated or revoked.  Performed at [facility] [place] [facility], [address], [place], [place] [address]    WBC 10/03/2021 6.4  4.0 - 10.5 K/uL Final   RBC 10/03/2021 4.65  3.87 - 5.11 MIL/uL Final   Hemoglobin 10/03/2021 15.1 (H)  12.0 - 15.0 g/dL Final   HCT 60/45/4098 44.7  36.0 - 46.0 % Final   MCV 10/03/2021 96.1  80.0 - 100.0 fL Final   MCH 10/03/2021 32.5  26.0 - 34.0 pg Final   MCHC 10/03/2021 33.8  30.0 - 36.0 g/dL Final   RDW 11/91/4782 13.6  11.5 - 15.5 % Final   Platelets 10/03/2021 264  150 - 400 K/uL Final   nRBC 10/03/2021 0.0  0.0 - 0.2 % Final   Neutrophils Relative % 10/03/2021 69  % Final   Neutro Abs 10/03/2021 4.5  1.7 - 7.7 K/uL Final   Lymphocytes Relative 10/03/2021 19  % Final   Lymphs Abs 10/03/2021 1.2  0.7 - 4.0 K/uL Final   Monocytes Relative 10/03/2021 11  % Final   Monocytes Absolute 10/03/2021 0.7  0.1 - 1.0 K/uL Final   Eosinophils Relative 10/03/2021 0  % Final   Eosinophils Absolute 10/03/2021 0.0  0.0 - 0.5 K/uL Final   Basophils Relative 10/03/2021 0  % Final   Basophils Absolute 10/03/2021 0.0  0.0 - 0.1 K/uL Final   Immature Granulocytes 10/03/2021 1  % Final   Abs Immature Granulocytes  10/03/2021 0.04  0.00 - 0.07 K/uL Final   Performed at [facility], [address], [place], [place] [address]   Sodium 10/03/2021 138  135 - 145 mmol/L Final   Potassium  10/03/2021 3.5  3.5 - 5.1 mmol/L Final   Chloride 10/03/2021 105  98 - 111 mmol/L Final   CO2 10/03/2021 20 (L)  22 - 32 mmol/L Final   Glucose, Bld 10/03/2021 98  70 - 99 mg/dL Final   Glucose reference range applies only to samples taken after fasting for at least 8 hours.   BUN 10/03/2021 10  6 - 20 mg/dL Final   Creatinine, Ser 10/03/2021 0.69  0.44 - 1.00 mg/dL Final   Calcium 38/45/3646 8.6 (L)  8.9 - 10.3 mg/dL Final   Total Protein 80/32/1224 6.3 (L)  6.5 - 8.1 g/dL Final   Albumin 82/50/0370 3.9  3.5 - 5.0 g/dL Final   AST 48/88/9169 48 (H)  15 - 41 U/L Final   ALT 10/03/2021 24  0 - 44 U/L Final   Alkaline Phosphatase 10/03/2021 41  38 - 126 U/L Final   Total Bilirubin 10/03/2021 0.9  0.3 - 1.2 mg/dL Final   GFR, Estimated 10/03/2021 >60  >60 mL/min Final   Comment: (NOTE) Calculated using the CKD-EPI Creatinine Equation (2021)    Anion gap 10/03/2021 13  5 - 15 Final   Performed at [facility], [address] [place], [place] [address]   Hgb A1c MFr Bld 10/03/2021 4.7 (L)  4.8 - 5.6 % Final   Comment: (NOTE) Pre diabetes:          5.7%-6.4%  Diabetes:              >6.4%  Glycemic control for   <7.0% adults with diabetes    Mean Plasma Glucose 10/03/2021 88.19  mg/dL Final   Performed at [facility], [address] [place], [place] [address]   Magnesium 10/03/2021 1.7  1.7 - 2.4 mg/dL Final   Performed at [facility], [address], [place], [place] [address]   Alcohol, Ethyl (B) 10/03/2021 217 (H)  <10 mg/dL Final   Comment: (NOTE) Lowest detectable limit for serum alcohol is 10 mg/dL.  For medical purposes only. Performed at [facility], [address] [place], [place] [address]    Cholesterol 10/03/2021 146  0 - 200 mg/dL Final   Triglycerides 56/97/9480 53  <150 mg/dL Final   HDL 16/55/3748 92  >40 mg/dL Final   Total CHOL/HDL Ratio 10/03/2021 1.6  RATIO Final   VLDL 10/03/2021 11  0 - 40 mg/dL Final   LDL Cholesterol  10/03/2021 43  0 - 99 mg/dL Final   Comment:        Total Cholesterol/HDL:CHD Risk Coronary Heart Disease Risk Table                     Men   Women  1/2 Average Risk   3.4   3.3  Average Risk       5.0   4.4  2 X Average Risk   9.6   7.1  3 X Average Risk  23.4   11.0        Use the calculated Patient Ratio above and the CHD Risk Table to determine the patient's CHD Risk.        ATP III CLASSIFICATION (LDL):  <100     mg/dL   Optimal  100-129  mg/dL   Near or Above                    Optimal  130-159  mg/dL   Borderline  409-811  mg/dL   High  >914     mg/dL   Very High Performed at [facility], [address], [place], [place] [address]    Preg Test, Ur 10/03/2021 NEGATIVE  NEGATIVE Final   Comment:        THE SENSITIVITY OF THIS METHODOLOGY IS >20 mIU/mL. Performed at [facility], [address], [place], [place] [address]    POC Amphetamine UR 10/03/2021 None Detected  NONE DETECTED (Cut Off Level 1000 ng/mL) Final   POC Secobarbital (BAR) 10/03/2021 None Detected  NONE DETECTED (Cut Off Level 300 ng/mL) Final   POC Buprenorphine (BUP) 10/03/2021 None Detected  NONE DETECTED (Cut Off Level 10 ng/mL) Final   POC Oxazepam (BZO) 10/03/2021 None Detected  NONE DETECTED (Cut Off Level 300 ng/mL) Final   POC Cocaine UR 10/03/2021 None Detected  NONE DETECTED (Cut Off Level 300 ng/mL) Final   POC Methamphetamine UR 10/03/2021 None Detected  NONE DETECTED (Cut Off Level 1000 ng/mL) Final   POC Morphine 10/03/2021 None Detected  NONE DETECTED (Cut Off Level 300 ng/mL) Final   POC Oxycodone UR 10/03/2021 None Detected  NONE DETECTED (Cut Off Level 100 ng/mL) Final   POC Methadone UR 10/03/2021 None Detected  NONE DETECTED (Cut Off Level 300 ng/mL) Final   POC Marijuana UR 10/03/2021 Positive (A)  NONE DETECTED (Cut Off Level 50 ng/mL) Final   SARS Coronavirus 2 Ag 10/03/2021 Negative  Negative Final   Preg Test, Ur 10/03/2021 NEGATIVE  NEGATIVE Final   Comment:         THE SENSITIVITY OF THIS METHODOLOGY IS >24 mIU/mL   Hospital Outpatient Visit on 05/12/2021  Component Date Value Ref Range Status   Specimen Description 05/12/2021 ABSCESS RIGHT BREAST   Final   Special Requests 05/12/2021 Normal   Final   Gram Stain 05/12/2021    Final                   Value:ABUNDANT WBC PRESENT,BOTH PMN AND MONONUCLEAR RARE GRAM POSITIVE COCCI    Culture 05/12/2021    Final                   Value:RARE NORMAL SKIN FLORA NO STAPHYLOCOCCUS AUREUS ISOLATED NO GROUP A STREP (S.PYOGENES) ISOLATED RARE PREVOTELLA BIVIA BETA LACTAMASE POSITIVE Performed at [facility], [address] [place], [place] [address]    Report Status 05/12/2021 05/15/2021 FINAL   Final    Allergies: Patient has no known allergies.  PTA Medications: (Not in a hospital admission)   Medical Decision Making  Patient meets criteria for inpatient psychiatric admission: Patient presents with suicidal ideations with a plan to overdose.  Patient has a history of one suicide attempt by overdose with an inpatient psychiatric admission.  Risk factors include, previous suicide attempt loss of significant relationship and anniversary of her father's death.  Patient cannot contract for safety.  Patient's mother and sister believe she is a danger to herself.  Discussed inpatient psychiatric admission with patient.  Patient agreed    Recommendations  Based on my evaluation the patient does not appear to have an emergency medical condition.  Patient meets criteria for inpatient psychiatric admission.  [facility] contacted patient has been accepted for an  inpatient psychiatric bed.  Accepting provider is Dr. Sherron Flemings  Lab work ordered: CBC with differential, CMP, ethanol, hemoglobin A1c, HIV antibody, lipid panel, magnesium, urine pregnancy, RPR, TSH, U/A, UDS, chlamydia, COVID POC and PCR..  EKG ordered    Ardis Hughs, NP 10/03/21  7:09 PM

## 2021-10-03 NOTE — ED Notes (Signed)
Pt is the bed sleeping. Respirations are even and unlabored. No acute distress noted. Will continue to monitor for safety. 

## 2021-10-03 NOTE — BH Assessment (Signed)
Comprehensive Clinical Assessment (CCA) Note  10/03/2021 Ashley Bautista HC:[id number]  Disposition: Per Letta Kocher, NP, patient is recommended for inpatient treatment.   [facility] from 10/03/2021 in [facility] ED from 08/31/2021 in [facility] ED from 04/17/2021 in [facility] High Risk No Risk No Risk      The patient demonstrates the following risk factors for suicide: Chronic risk factors for suicide include: psychiatric disorder of depression, substance use disorder, and previous suicide attempts x1 . Acute risk factors for suicide include: family or marital conflict. Protective factors for this patient include: positive social support. Considering these factors, the overall suicide risk at this point appears to be high. Patient is not appropriate for outpatient follow up.  Ashley Bautista is a 35 year old female presenting to [facility] voluntarily with chief complaint of suicidal ideations with a plan to overdose on medications. Patient reports worsening depression and suicidal ideations for the past month and a half. Patient reports moving to [place] from [place] in April and reports she has been depressed for awhile but was trying to deal with the depression on her own. Patient reports feeling like she is not doing enough and feels like she is too old to be asking for help. Patient reports recent stressors of having an argument with her partner yesterday causing her partner to leave. Patient also reports worsening of depressive symptoms in Nov-Dec, due to her dad's death anniversary in 09/08/2023 and his birthday in December. Patient reports history of prior mental health services however she does not currently have any services. Patient also reports history of inpatient treatment in [place] after a suicide attempt by overdosing in 2012. Patient reports drinking a fifth of alcohol and using THC daily  for the past 4 to 5 years. Patient reports tremors when she is not drinking and denies history of seizures. Patient reports living with her partner, and she is employed as a Glass blower/designer. Patient denies having access to a gun and she denies having legal issues.   Patient oriented to person, place and situation. Patient eye contact and speech is normal, affect is depressed with congruent mood. Patient reports SI with plan to OD and she can not contract for safety. Patient reports thoughts about wanting to hurt her partner but denies plan or intent. Patient denies AVH however, reports AH of voices telling her that she is not going to be nothing, and she would be better off dead. Patient consents for TTS and NP to obtain collateral from her mother and sister. Mom reports that patient communicated several times today that she wanted to die. Mom reports patient called her early this morning and told her to come get her dog because she was going to harm herself. Mom and sister does not feel safe with patient discharging today.      Chief Complaint:  Chief Complaint  Patient presents with   Suicidal   Visit Diagnosis: MDD, severe, recurrent without psychosis     CCA Screening, Triage and Referral (STR)  Patient Reported Information How did you hear about [place]? Self  What Is the Reason for Your Visit/Call Today? Suicidal  How Long Has This Been Causing You Problems? 1-6 months  What Do You Feel Would Help You the Most Today? Treatment for Depression or other mood problem   Have You Recently Had Any Thoughts About Hurting Yourself? Yes  Are You Planning to Commit Suicide/Harm Yourself At This  time? Yes   Have you Recently Had Thoughts About Hurting Someone Karolee Ohslse? Yes  Are You Planning to Harm Someone at This Time? No  Explanation: No data recorded  Have You Used Any Alcohol or Drugs in the Past 24 Hours? Yes  How Long Ago Did You Use Drugs or Alcohol? No data recorded What Did You Use  and How Much? No data recorded  Do You Currently Have a Therapist/Psychiatrist? No data recorded Name of Therapist/Psychiatrist: No data recorded  Have You Been Recently Discharged From Any Office Practice or Programs? No data recorded Explanation of Discharge From Practice/Program: No data recorded    CCA Screening Triage Referral Assessment Type of Contact: No data recorded Telemedicine Service Delivery:   Is this Initial or Reassessment? No data recorded Date Telepsych consult ordered in [facility]:  No data recorded Time Telepsych consult ordered in [facility]:  No data recorded Location of Assessment: No data recorded Provider Location: No data recorded  Collateral Involvement: No data recorded  Does Patient Have a Court Appointed Legal Guardian? No data recorded Name and Contact of Legal Guardian: No data recorded If Minor and Not Living with Parent(s), Who has Custody? No data recorded Is CPS involved or ever been involved? No data recorded Is APS involved or ever been involved? No data recorded  Patient Determined To Be At Risk for Harm To Self or Others Based on Review of Patient Reported Information or Presenting Complaint? No data recorded Method: No data recorded Availability of Means: No data recorded Intent: No data recorded Notification Required: No data recorded Additional Information for Danger to Others Potential: No data recorded Additional Comments for Danger to Others Potential: No data recorded Are There Guns or Other Weapons in Your Home? No data recorded Types of Guns/Weapons: No data recorded Are These Weapons Safely Secured?                            No data recorded Who Could Verify You Are Able To Have These Secured: No data recorded Do You Have any Outstanding Charges, Pending Court Dates, Parole/Probation? No data recorded Contacted To Inform of Risk of Harm To Self or Others: No data recorded   Does Patient Present under Involuntary Commitment? No data  recorded IVC Papers Initial File Date: No data recorded  [place] data recorded  Patient Currently Receiving the Following Services: No data recorded  Determination of Need: Emergent (2 hours)   Options For Referral: Outpatient Therapy; Medication Management; Inpatient Hospitalization     CCA Biopsychosocial Patient Reported Schizophrenia/Schizoaffective Diagnosis in Past: No   Strengths: No data recorded  Mental Health Symptoms Depression:   Change in energy/activity; Difficulty Concentrating; Irritability; Sleep (too much or little); Tearfulness   Duration of Depressive symptoms:  Duration of Depressive Symptoms: Greater than two weeks   Mania:   None   Anxiety:    Tension; Sleep; Worrying   Psychosis:   None   Duration of Psychotic symptoms:    Trauma:   N/A   Obsessions:   N/A   Compulsions:   N/A   Inattention:   N/A   Hyperactivity/Impulsivity:   N/A   Oppositional/Defiant Behaviors:   N/A   Emotional Irregularity:   N/A   Other Mood/Personality Symptoms:  No data recorded   Mental Status Exam Appearance and self-care  Stature:   Average   Weight:   Average weight   Clothing:   Age-appropriate  Grooming:   Normal   Cosmetic use:   None   Posture/gait:   Normal   Motor activity:   Not Remarkable   Sensorium  Attention:   Normal   Concentration:   Normal   Orientation:   Person; Place; Situation   Recall/memory:   Normal   Affect and Mood  Affect:   Depressed   Mood:   Depressed   Relating  Eye contact:   Normal   Facial expression:   Depressed; Responsive   Attitude toward examiner:   Cooperative   Thought and Language  Speech flow:  Clear and Coherent   Thought content:   Appropriate to Mood and Circumstances   Preoccupation:   None   Hallucinations:   None   Organization:  No data recorded  [employer] of Knowledge:   Good   Intelligence:    Average   Abstraction:   Normal   Judgement:   Fair   Reality Testing:   Adequate   Insight:   Fair   Decision Making:   Normal   Social Functioning  Social Maturity:   Responsible   Social Judgement:   Normal   Stress  Stressors:   Relationship; Grief/losses   Coping Ability:   Overwhelmed; Exhausted   Skill Deficits:   Responsibility   Supports:   Family     Religion:    Leisure/Recreation:    Exercise/Diet:     CCA Employment/Education Employment/Work Situation: Employment / Work Situation Employment Situation: Employed Patient's Job has Been Impacted by Current Illness: No  Education: Education Is Patient Currently Attending School?: No   CCA Family/Childhood History Family and Relationship History: Family history Marital status: Single Does patient have children?: No  Childhood History:     Child/Adolescent Assessment:     CCA Substance Use Alcohol/Drug Use: Alcohol / Drug Use Pain Medications: See MAR Prescriptions: See MAR Over the Counter: See MAR History of alcohol / drug use?: Yes Withdrawal Symptoms: Tremors Substance #1 Name of Substance 1: ETOH 1 - Amount (size/oz): fifth daily 1 - Frequency: daily 1 - Duration: 3-4 years 1 - Last Use / Amount: unknown Substance #2 Name of Substance 2: THC 2 - Amount (size/oz): 1 blunt 2 - Frequency: daily                     ASAM's:  Six Dimensions of Multidimensional Assessment  Dimension 1:  Acute Intoxication and/or Withdrawal Potential:      Dimension 2:  Biomedical Conditions and Complications:      Dimension 3:  Emotional, Behavioral, or Cognitive Conditions and Complications:     Dimension 4:  Readiness to Change:     Dimension 5:  Relapse, Continued use, or Continued Problem Potential:     Dimension 6:  Recovery/Living Environment:     ASAM Severity Score:    ASAM Recommended Level of Treatment: ASAM Recommended Level of Treatment: Level I  Outpatient Treatment   Substance use Disorder (SUD)    Recommendations for Services/Supports/Treatments: Recommendations for Services/Supports/Treatments Recommendations For Services/Supports/Treatments: Individual Therapy  Discharge Disposition:    DSM5 Diagnoses: There are no problems to display for this patient.    Referrals to Alternative Service(s): Referred to Alternative Service(s):   Place:   Date:   Time:    Referred to Alternative Service(s):   Place:   Date:   Time:    Referred to Alternative Service(s):   Place:   Date:   Time:  Referred to Alternative Service(s):   Place:   Date:   Time:     Luther Redo, [facility]

## 2021-10-04 ENCOUNTER — Encounter (HOSPITAL_COMMUNITY): Payer: Self-pay | Admitting: Registered Nurse

## 2021-10-04 ENCOUNTER — Other Ambulatory Visit: Payer: Self-pay

## 2021-10-04 ENCOUNTER — Encounter (HOSPITAL_COMMUNITY): Payer: Self-pay

## 2021-10-04 ENCOUNTER — Inpatient Hospital Stay (HOSPITAL_COMMUNITY)
Admission: AD | Admit: 2021-10-04 | Discharge: 2021-10-09 | DRG: 885 | Disposition: A | Discharge status: Home / Self Care | Payer: BC Managed Care – PPO | Source: Intra-hospital | Attending: Psychiatry | Admitting: Psychiatry

## 2021-10-04 DIAGNOSIS — Y907 Blood alcohol level of 200-239 mg/100 ml: Secondary | ICD-10-CM | POA: Diagnosis present

## 2021-10-04 DIAGNOSIS — Z79899 Other long term (current) drug therapy: Secondary | ICD-10-CM | POA: Diagnosis not present

## 2021-10-04 DIAGNOSIS — F332 Major depressive disorder, recurrent severe without psychotic features: Principal | ICD-10-CM | POA: Diagnosis present

## 2021-10-04 DIAGNOSIS — F1020 Alcohol dependence, uncomplicated: Secondary | ICD-10-CM | POA: Diagnosis present

## 2021-10-04 DIAGNOSIS — G4700 Insomnia, unspecified: Secondary | ICD-10-CM | POA: Diagnosis present

## 2021-10-04 DIAGNOSIS — F17290 Nicotine dependence, other tobacco product, uncomplicated: Secondary | ICD-10-CM | POA: Diagnosis present

## 2021-10-04 DIAGNOSIS — Z20822 Contact with and (suspected) exposure to covid-19: Secondary | ICD-10-CM | POA: Diagnosis present

## 2021-10-04 DIAGNOSIS — F1210 Cannabis abuse, uncomplicated: Secondary | ICD-10-CM | POA: Diagnosis present

## 2021-10-04 DIAGNOSIS — R45851 Suicidal ideations: Secondary | ICD-10-CM | POA: Diagnosis present

## 2021-10-04 LAB — GC/CHLAMYDIA PROBE AMP (~~LOC~~) NOT AT ARMC
Chlamydia: NEGATIVE
Comment: NEGATIVE
Comment: NORMAL
Neisseria Gonorrhea: NEGATIVE

## 2021-10-04 LAB — RPR: RPR Ser Ql: NONREACTIVE

## 2021-10-04 LAB — HEPATIC FUNCTION PANEL
ALT: 24 U/L (ref 0–44)
AST: 36 U/L (ref 15–41)
Albumin: 4.2 g/dL (ref 3.5–5.0)
Alkaline Phosphatase: 47 U/L (ref 38–126)
Bilirubin, Direct: 0.3 mg/dL — ABNORMAL HIGH (ref 0.0–0.2)
Indirect Bilirubin: 1.5 mg/dL — ABNORMAL HIGH (ref 0.3–0.9)
Total Bilirubin: 1.8 mg/dL — ABNORMAL HIGH (ref 0.3–1.2)
Total Protein: 6.7 g/dL (ref 6.5–8.1)

## 2021-10-04 MED ORDER — LORAZEPAM 1 MG PO TABS
1.0000 mg | ORAL_TABLET | Freq: Every day | ORAL | Status: DC
  Filled 2021-10-04: qty 1

## 2021-10-04 MED ORDER — LORAZEPAM 1 MG PO TABS: 1.0000 mg | ORAL_TABLET | Freq: Four times a day (QID) | ORAL | Status: AC | PRN

## 2021-10-04 MED ORDER — LORAZEPAM 1 MG PO TABS
1.0000 mg | ORAL_TABLET | Freq: Two times a day (BID) | ORAL | Status: AC
  Administered 2021-10-07 (×2): 1 mg via ORAL
  Filled 2021-10-04 (×2): qty 1

## 2021-10-04 MED ORDER — THIAMINE HCL 100 MG PO TABS
100.0000 mg | ORAL_TABLET | Freq: Every day | ORAL | Status: DC
  Administered 2021-10-05 – 2021-10-09 (×5): 100 mg via ORAL
  Filled 2021-10-04 (×7): qty 1

## 2021-10-04 MED ORDER — ALUM & MAG HYDROXIDE-SIMETH 200-200-20 MG/5ML PO SUSP: 30.0000 mL | ORAL | Status: DC | PRN

## 2021-10-04 MED ORDER — HYDROXYZINE HCL 25 MG PO TABS
25.0000 mg | ORAL_TABLET | Freq: Four times a day (QID) | ORAL | Status: AC | PRN
  Administered 2021-10-04: 25 mg via ORAL
  Filled 2021-10-04: qty 1

## 2021-10-04 MED ORDER — LORAZEPAM 1 MG PO TABS
1.0000 mg | ORAL_TABLET | Freq: Four times a day (QID) | ORAL | Status: AC
  Administered 2021-10-04 – 2021-10-05 (×6): 1 mg via ORAL
  Filled 2021-10-04 (×6): qty 1

## 2021-10-04 MED ORDER — LOPERAMIDE HCL 2 MG PO CAPS: 2.0000 mg | ORAL_CAPSULE | ORAL | Status: AC | PRN

## 2021-10-04 MED ORDER — SERTRALINE HCL 25 MG PO TABS
25.0000 mg | ORAL_TABLET | Freq: Every day | ORAL | Status: DC
  Administered 2021-10-05 – 2021-10-09 (×5): 25 mg via ORAL
  Filled 2021-10-04 (×7): qty 1

## 2021-10-04 MED ORDER — ADULT MULTIVITAMIN W/MINERALS CH
1.0000 | ORAL_TABLET | Freq: Every day | ORAL | Status: DC
  Administered 2021-10-04 – 2021-10-09 (×6): 1 via ORAL
  Filled 2021-10-04 (×9): qty 1

## 2021-10-04 MED ORDER — BENZOCAINE 10 % MT GEL
Freq: Three times a day (TID) | OROMUCOSAL | Status: DC | PRN
  Administered 2021-10-05: 1 via OROMUCOSAL
  Filled 2021-10-04: qty 9

## 2021-10-04 MED ORDER — ONDANSETRON 4 MG PO TBDP: 4.0000 mg | ORAL_TABLET | Freq: Four times a day (QID) | ORAL | Status: AC | PRN

## 2021-10-04 MED ORDER — LORAZEPAM 1 MG PO TABS
1.0000 mg | ORAL_TABLET | Freq: Three times a day (TID) | ORAL | Status: AC
  Administered 2021-10-06 (×3): 1 mg via ORAL
  Filled 2021-10-04 (×2): qty 1

## 2021-10-04 MED ORDER — GABAPENTIN 100 MG PO CAPS
100.0000 mg | ORAL_CAPSULE | Freq: Three times a day (TID) | ORAL | Status: DC
  Administered 2021-10-04 – 2021-10-05 (×3): 100 mg via ORAL
  Filled 2021-10-04 (×9): qty 1

## 2021-10-04 NOTE — Group Note (Addendum)
LCSW Group Therapy Note  Group Date: 10/04/2021 Start Time: 1300 End Time: 1400   Type of Therapy and Topic:  Group Therapy - Healthy vs Unhealthy Coping Skills  Participation Level:  Active   Description of Group The focus of this group was to determine what unhealthy coping techniques typically are used by group members and what healthy coping techniques would be helpful in coping with various problems. Patients were guided in becoming aware of the differences between healthy and unhealthy coping techniques. Patients were asked to identify 2-3 healthy coping skills they would like to learn to use more effectively.  Therapeutic Goals Patients learned that coping is what human beings do all day long to deal with various situations in their lives Patients defined and discussed healthy vs unhealthy coping techniques Patients identified their preferred coping techniques and identified whether these were healthy or unhealthy Patients determined 2-3 healthy coping skills they would like to become more familiar with and use more often. Patients provided support and ideas to each other   Summary of Patient Progress:  During group the Pt expressed that spending time with their pet was a helpful coping skill for them. Patient proved open to input from peers and feedback from CSW. Patient demonstrated insight into the subject matter, was respectful of peers, and participated throughout the entire session.   Therapeutic Modalities Cognitive Behavioral Therapy Motivational Interviewing  Aram Beecham, [place] 10/04/2021  2:41 PM

## 2021-10-04 NOTE — Group Note (Signed)
Recreation Therapy Group Note   Group Topic:Stress Management  Group Date: 10/04/2021 Start Time: 0930 End Time: 0950 Facilitators: Caroll Rancher, LRT,CTRS Location: [address] Dayroom   Goal Area(s) Addresses:  Patient will actively participate in stress management techniques presented during session.  Patient will successfully identify benefit of practicing stress management post d/c.   Group Description: Guided Imagery. LRT provided education, instruction, and demonstration on practice of visualization via guided imagery. Patient was asked to participate in the technique introduced during session. LRT debriefed including topics of mindfulness, stress management and specific scenarios each patient could use these techniques. Patients were given suggestions of ways to access scripts post d/c and encouraged to explore Youtube and other apps available on smartphones, tablets, and computers.   Affect/Mood: N/A   Participation Level: Did not attend    Clinical Observations/Individualized Feedback:     Plan: Continue to engage patient in RT group sessions 2-3x/week.   Caroll Rancher, LRT,CTRS 10/04/2021 1:06 PM

## 2021-10-04 NOTE — Progress Notes (Signed)
Pt denies SI/HI/AVH and verbally agrees to approach staff if these become apparent or before harming themselves/others. Rates depression 7/10. Rates anxiety 5/10. Rates pain 0/10. Pt stated that she was depressed due to just coming here and pt started to cry. Pt did calm down and was appreciative of care. Pt did not attend morning group but did for the afternoon. Scheduled medications administered to Pt, per [place] orders. RN provided support and encouragement to Pt. Q15 min safety checks implemented and continued. Pt safe on the unit. RN will continue to monitor and intervene as needed.   10/04/21 0802  Psych Admission Type (Psych Patients Only)  Admission Status Voluntary  Psychosocial Assessment  Patient Complaints Anxiety;Depression  Eye Contact Fair  Facial Expression Sad;Flat  Affect Depressed;Sad  Speech Logical/coherent  Interaction Assertive;Forwards little  Motor Activity Slow  Appearance/Hygiene Unremarkable  Behavior Characteristics Cooperative;Calm;Appropriate to situation  Mood Depressed;Sad;Pleasant  Thought Process  Coherency WDL  Content WDL  Delusions WDL  Perception WDL  Hallucination None reported or observed  Judgment WDL  Confusion None  Danger to Self  Current suicidal ideation? Denies  Self-Injurious Behavior No self-injurious ideation or behavior indicators observed or expressed   Agreement Not to Harm Self Yes  Description of Agreement Verbal agreement to not harm self  Danger to Others  Danger to Others None reported or observed
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
## 2021-10-04 NOTE — Progress Notes (Signed)
Ashley Bautista is a 35 year old female being admitted voluntarily to [facility] from [facility].  She presented to [facility] with increasing depression and suicidal ideation with plan to OD on medications.  She recently moved to [facility] from [place] in April and has been struggling with depression for some time.  She recently got into an argument with her partner and she (partner) left.  She also reported this time of year is bad because her father's death was the day after christmas and his birthday was in November.  She has a history of one previous suicide attempt by OD in 2012.  She has been drinking 1/5th of liquor daily.  She has only had history of tremors when coming off alcohol.  No history of seizures or DT's.  During [facility] admission, she was pleasant and cooperative.  She presents as sad, flat and depressed.  She continues to voice SI with plan to OD but verbally agrees to not harm herself on the unit.  She denies HI or AVH.  She does however talk about hearing her "inner voice."  She has been having tooth pain in the upper right molars and has been using orajel prn.  Pain is 4/10 (10 the worst).  She declined any medications at this time.  Oriented her to the unit.  Admission paperwork completed and signed.  Belongings searched and secured in locker # 10, no contraband found.  Skin assessment completed and no skin issues noted.  Suicide safety plan reviewed, given to patient to complete and return to her nurse.  Q 15 minute checks initiated for safety.  We will continue to monitor the progress towards her goals.

## 2021-10-04 NOTE — BH IP Treatment Plan (Signed)
Interdisciplinary Treatment and Diagnostic Plan Update  10/04/2021 Time of Session: 9:25am  Ashley Bautista MRN: [id number]  Principal Diagnosis: MDD (major depressive disorder), recurrent episode, severe ([place])  Secondary Diagnoses: Principal Problem:   MDD (major depressive disorder), recurrent episode, severe ([place])   Current Medications:  Current Facility-Administered Medications  Medication Dose Route Frequency Provider Last Rate Last Admin   alum & mag hydroxide-simeth (MAALOX/MYLANTA) 200-200-20 MG/5ML suspension 30 mL  30 mL Oral Q4H PRN Rankin, Shuvon B, NP       benzocaine (ORAJEL) 10 % mucosal gel   Mouth/Throat TID PRN Margorie John W, PA-C       hydrOXYzine (ATARAX) tablet 25 mg  25 mg Oral Q6H PRN Revonda Humphrey, NP   25 mg at 10/04/21 0802   loperamide (IMODIUM) capsule 2-4 mg  2-4 mg Oral PRN Revonda Humphrey, NP       LORazepam (ATIVAN) tablet 1 mg  1 mg Oral Q6H PRN Revonda Humphrey, NP       multivitamin with minerals tablet 1 tablet  1 tablet Oral Daily Revonda Humphrey, NP   1 tablet at 10/04/21 0803   ondansetron (ZOFRAN-ODT) disintegrating tablet 4 mg  4 mg Oral Q6H PRN Revonda Humphrey, NP       Derrill Memo ON 10/05/2021] thiamine tablet 100 mg  100 mg Oral Daily Revonda Humphrey, NP       PTA Medications: Medications Prior to Admission  Medication Sig Dispense Refill Last Dose   ibuprofen (ADVIL) 200 MG tablet Take 200 mg by mouth every 6 (six) hours as needed for moderate pain, mild pain or headache.      oxyCODONE-acetaminophen (PERCOCET/ROXICET) 5-325 MG tablet Take 1 tablet by mouth every 8 (eight) hours as needed for severe pain. 8 tablet 0    sulfamethoxazole-trimethoprim (BACTRIM DS) 800-160 MG tablet Take 1 tablet by mouth 2 (two) times daily.       Patient Stressors: Marital or family conflict   Substance abuse    Patient Strengths: Capable of independent living  Marketing executive fund of knowledge  Motivation for treatment/growth   Physical Health  Supportive family/friends   Treatment Modalities: Medication Management, Group therapy, Case management,  1 to 1 session with clinician, Psychoeducation, Recreational therapy.   Physician Treatment Plan for Primary Diagnosis: MDD (major depressive disorder), recurrent episode, severe ([place]) Long Term Goal(s):     Short Term Goals:    Medication Management: Evaluate patient's response, side effects, and tolerance of medication regimen.  Therapeutic Interventions: 1 to 1 sessions, Unit Group sessions and Medication administration.  Evaluation of Outcomes: Not Met  Physician Treatment Plan for Secondary Diagnosis: Principal Problem:   MDD (major depressive disorder), recurrent episode, severe ([place])  Long Term Goal(s):     Short Term Goals:       Medication Management: Evaluate patient's response, side effects, and tolerance of medication regimen.  Therapeutic Interventions: 1 to 1 sessions, Unit Group sessions and Medication administration.  Evaluation of Outcomes: Not Met   RN Treatment Plan for Primary Diagnosis: MDD (major depressive disorder), recurrent episode, severe ([place]) Long Term Goal(s): Knowledge of disease and therapeutic regimen to maintain health will improve  Short Term Goals: Ability to remain free from injury will improve, Ability to participate in decision making will improve, Ability to verbalize feelings will improve, Ability to disclose and discuss suicidal ideas, and Ability to identify and develop effective coping behaviors will improve  Medication Management: RN will administer medications as ordered  by provider, will assess and evaluate patient's response and provide education to patient for prescribed medication. RN will report any adverse and/or side effects to prescribing provider.  Therapeutic Interventions: 1 on 1 counseling sessions, Psychoeducation, Medication administration, Evaluate responses to treatment, Monitor vital signs and  CBGs as ordered, Perform/monitor CIWA, COWS, AIMS and Fall Risk screenings as ordered, Perform wound care treatments as ordered.  Evaluation of Outcomes: Not Met   LCSW Treatment Plan for Primary Diagnosis: MDD (major depressive disorder), recurrent episode, severe ([place]) Long Term Goal(s): Safe transition to appropriate next level of care at discharge, Engage patient in therapeutic group addressing interpersonal concerns.  Short Term Goals: Engage patient in aftercare planning with referrals and resources, Increase social support, Increase emotional regulation, Facilitate acceptance of mental health diagnosis and concerns, Identify triggers associated with mental health/substance abuse issues, and Increase skills for wellness and recovery  Therapeutic Interventions: Assess for all discharge needs, 1 to 1 time with Social worker, Explore available resources and support systems, Assess for adequacy in community support network, Educate family and significant other(s) on suicide prevention, Complete Psychosocial Assessment, Interpersonal group therapy.  Evaluation of Outcomes: Not Met   Progress in Treatment: Attending groups: Yes. Participating in groups: Yes. Taking medication as prescribed: Yes. Toleration medication: Yes. Family/Significant other contact made: Yes, individual(s) contacted:  Mother  Patient understands diagnosis: Yes. Discussing patient identified problems/goals with staff: Yes. Medical problems stabilized or resolved: Yes. Denies suicidal/homicidal ideation: Yes. Issues/concerns per patient self-inventory: No.   New problem(s) identified: No, Describe:  None   New Short Term/Long Term Goal(s): medication stabilization, elimination of SI thoughts, development of comprehensive mental wellness plan.   Patient Goals: "To get on the right medications"   Discharge Plan or Barriers: Patient recently admitted. CSW will continue to follow and assess for appropriate  referrals and possible discharge planning.   Reason for Continuation of Hospitalization: Depression Medication stabilization Suicidal ideation Withdrawal symptoms  Estimated Length of Stay: 3 to 5 days    Scribe for Treatment Team: Darleen Crocker, Latanya Presser 10/04/2021 1:37 PM

## 2021-10-04 NOTE — Progress Notes (Signed)
Adult Psychoeducational Group Note  Date:  10/04/2021 Time:  8:48 PM  Group Topic/Focus:  Wrap-Up Group:   The focus of this group is to help patients review their daily goal of treatment and discuss progress on daily workbooks.  Participation Level:  Active  Participation Quality:  Appropriate  Affect:  Appropriate  Cognitive:  Appropriate  Insight: Appropriate  Engagement in Group:  Engaged  Modes of Intervention:  Discussion  Additional Comments:  Patient said her day was a 6. Her goal for today get out of bed and she achieved her goal. Coping skills helpful writing and drawing.  Charna Busman Long 10/04/2021, 8:48 PM

## 2021-10-04 NOTE — BHH Suicide Risk Assessment (Addendum)
[facility] Admission Suicide Risk Assessment   Nursing information obtained from:  Patient Demographic factors:  Gay, lesbian, or bisexual orientation, Living alone Current Mental Status:  Suicidal ideation indicated by patient Loss Factors:  Loss of significant relationship Historical Factors:  Prior suicide attempts, alcohol use prior to admission Risk Reduction Factors:  employed  Total Time Spent in Direct Patient Care:  I personally spent 45 minutes on the unit in direct patient care. The direct patient care time included face-to-face time with the patient, reviewing the patient's chart, communicating with other professionals, and coordinating care. Greater than 50% of this time was spent in counseling or coordinating care with the patient regarding goals of hospitalization, psycho-education, and discharge planning needs.  Principal Problem: MDD (major depressive disorder), recurrent severe, without psychosis ([place]) Diagnosis:  Principal Problem:   MDD (major depressive disorder), recurrent severe, without psychosis ([place]) Active Problems:   Alcohol use disorder, severe, dependence ([place])   Cannabis abuse  Subjective Data: The patient is a 35y/o female with self-reported past psychiatric history significant for bipolar disorder, and depression, who was voluntarily admitted from the [facility] for management of suicidal ideation.  The patient states that she recently broke up with a girlfriend after they had been together for 5 years and additionally has been struggling with the anniversary of her father's death (father died the day after Christmas) and her father's birthday which was around Thanksgiving.  She states over the last month and a half she has been having intermittent suicidal thinking and had been contemplating either overdosing or walking out into traffic in a suicide attempt.  She reached out to family who got her assistance at the behavioral health urgent care.  She admits to a previous  overdose in 2010 at which time she was admitted psychiatrically for 2 weeks in [place].  She previously was on a combination of Lexapro and Abilify but has not been on any psychotropic medications for the past 5 to 7 years.  For the last month and a half she states that she has not had good sleep, has had dysphoria, has suffered with anhedonia, has feelings of guilt, and has low energy, focus, and appetite.  She denies any history of PTSD and denies any history of psychosis.  She specifically denies AVH or paranoia.  She states that she had some homicidal thoughts toward her girlfriend after the break-up but denies any HI at this time.  She denies current suicidal thinking and can contract for safety on the unit.  When questioned about previous bipolar symptoms she cannot recall a time when clean and sober that she had decreased need for sleep or protracted episode of excessive energy.  She also denies periods of excessive spending, racing thoughts, impulsivity, hypersexuality, talkativeness, or euphoria.  She admits to drinking alcohol up to 1/5/day for the last 8 to 9 years including having a DUI in 2011.  She states she often has to start her day with 3 or 4 shots of alcohol but denies any history of DTs, withdrawal seizures, or current withdrawal symptoms.  She also has been smoking 1/2-1 blunt of marijuana daily for an unknown length of time but denies other illicit drug use.She states that she moved from [place] to [place] to be closer to family in April of this year and had been living in her own place with her girlfriend.  She works as a Glass blower/designer.  She identifies as lesbian and states that a former partner of hers has a child  that she continues to financially support in [place].  See H&P for additional details.  Continued Clinical Symptoms:  Alcohol Use Disorder Identification Test Final Score (AUDIT): 29 The "Alcohol Use Disorders Identification Test", Guidelines for Use in Primary  Care, Second Edition.  World Science writer [facility] [employer]). Score between 0-7:  no or low risk or alcohol related problems. Score between 8-15:  moderate risk of alcohol related problems. Score between 16-19:  high risk of alcohol related problems. Score 20 or above:  warrants further diagnostic evaluation for alcohol dependence and treatment.   CLINICAL FACTORS:   Depression:   Anhedonia Hopelessness Insomnia Severe Alcohol/Substance Abuse/Dependencies More than one psychiatric diagnosis Previous Psychiatric Diagnoses and Treatments   Musculoskeletal: Strength & Muscle Tone: within normal limits Gait & Station: normal Patient leans: N/A  Psychiatric Specialty Exam: Physical Exam Vitals and nursing note reviewed.  Constitutional:      Appearance: Normal appearance.  HENT:     Head: Normocephalic and atraumatic.  Pulmonary:     Effort: Pulmonary effort is normal.  Neurological:     General: No focal deficit present.     Mental Status: She is alert.    Review of Systems  Constitutional:  Negative for fever.  HENT:  Negative for congestion.   Respiratory:  Negative for shortness of breath.   Cardiovascular:  Negative for chest pain.  Gastrointestinal:  Negative for constipation, diarrhea, nausea and vomiting.  Genitourinary:  Negative for difficulty urinating.  Skin:  Negative for rash.  Neurological:  Negative for headaches.   Blood pressure 116/83, pulse 77, temperature 98.3 F (36.8 C), temperature source Oral, resp. rate 18, height 5\' 6"  (1.676 m), weight 65.8 kg, SpO2 100 %.Body mass index is 23.4 kg/m.  General Appearance:  casually dressed, adequate hygiene  Eye Contact:  Good  Speech:  Clear and Coherent and Normal Rate  Volume:  Normal  Mood:  Dysphoric  Affect:  Constricted  Thought Process:  Goal Directed and Linear  Orientation:  Full (Time, Place, and Person)  Thought Content:  Logical and denies AVH, paranoia, or delusions  Suicidal Thoughts:   SI with  plan prior to admission but denies current SI and can contract for safety on the unit  Homicidal Thoughts:   Denied presently  Memory:  Recent;   Good  Judgement:  Fair  Insight:  Fair  Psychomotor Activity:  Normal  Concentration:  Concentration: Good and Attention Span: Good  Recall:  Good  Fund of Knowledge:  Good  Language:  Good  Akathisia:  Negative  Assets:  Communication Skills Desire for Improvement Housing Physical Health Resilience Social Support  ADL's:  Intact  Cognition:  WNL   COGNITIVE FEATURES THAT CONTRIBUTE TO RISK:  None    SUICIDE RISK:   Moderate:  Frequent suicidal ideation with limited intensity, and duration, some specificity in terms of plans, no associated intent, good self-control, limited dysphoria/symptomatology, some risk factors present, and identifiable protective factors, including available and accessible social support.  PLAN OF CARE: Patient is admitted voluntarily to the adult inpatient unit.  Admission labs were reviewed: CBC shows a white count of 6.4 hemoglobin 15.1 and hematocrit 44.7 with platelets 264, CMP within normal limits with the exception of a bicarb of 20, calcium 8.6 total protein 6.3 and AST of 48; magnesium 1.7: RPR nonreactive; HIV nonreactive; hemoglobin A1c 4.7; alcohol 217; lipid panel within normal limits respiratory panel negative; urinalysis within normal limits other than a specific gravity of greater than 1.030 and 15 ketones;  pregnancy test negative; urine drug screen positive for marijuana; coronavirus negative; GC chlamydia probe in process; EKG shows sinus bradycardia heart rate of 58 with minimal voltage criteria for LVH and a QTC of 416 ms  We will place patient on scheduled Ativan taper and CIWA protocol for alcohol withdrawal monitoring with oral thiamine and multivitamin replacement.  Given her mild elevation in her AST we will repeat a hepatic function panel and check an acute hepatitis panel.  Discussed various  antidepressant and mood stabilizer options with the patient and after extensive discussion of risk benefits and side effects she agrees to a trial of Zoloft 25 mg at bedtime.  The specific risks of potential GI side effects, sexual side effects, weight gain, and sedation as well as suicidal ideation with start of an antidepressant were reviewed and she consents to med trial.  She does not appear to meet criteria for bipolar spectrum illness based on screening questions today, and I question if she has been misdiagnosed in the past in the context of previous substance use.  We will watch her for signs of mood escalation with the start of an antidepressant.  Discussed the option to start Neurontin to help with anxiety as well as potential alcohol withdrawal and cravings and she consents to Neurontin 100 mg 3 times daily titrating up as tolerated over coming days.  She was counseled on the need to abstain from alcohol and illicit substance use and is precontemplative at this time as to her desire for potential outpatient substance abuse treatment.  I certify that inpatient services furnished can reasonably be expected to improve the patient's condition.   Harlow Asa, MD, FAPA 10/04/2021, 4:23 PM

## 2021-10-04 NOTE — Tx Team (Signed)
Initial Treatment Plan 10/04/2021 3:40 AM [name] Falling KKX:[id number]    PATIENT STRESSORS: Marital or family conflict   Substance abuse     PATIENT STRENGTHS: Capable of independent living  Communication skills  General fund of knowledge  Motivation for treatment/growth  Physical Health  Supportive family/friends    PATIENT IDENTIFIED PROBLEMS: Depression  Suicidal ideation    "Help me with my depression"  "Get on the right meds"             DISCHARGE CRITERIA:  Improved stabilization in mood, thinking, and/or behavior Need for constant or close observation no longer present Reduction of life-threatening or endangering symptoms to within safe limits Verbal commitment to aftercare and medication compliance  PRELIMINARY DISCHARGE PLAN: Outpatient therapy Medication management  PATIENT/FAMILY INVOLVEMENT: This treatment plan has been presented to and reviewed with the patient, Skarleth Delmonico.  The patient and family have been given the opportunity to ask questions and make suggestions.  Levin Bacon, RN 10/04/2021, 3:40 AM

## 2021-10-05 LAB — HEPATITIS PANEL, ACUTE
HCV Ab: NONREACTIVE
Hep A IgM: NONREACTIVE
Hep B C IgM: NONREACTIVE
Hepatitis B Surface Ag: NONREACTIVE

## 2021-10-05 MED ORDER — GABAPENTIN 100 MG PO CAPS
200.0000 mg | ORAL_CAPSULE | Freq: Three times a day (TID) | ORAL | Status: DC
  Administered 2021-10-05 – 2021-10-09 (×11): 200 mg via ORAL
  Filled 2021-10-05 (×16): qty 2

## 2021-10-05 NOTE — Progress Notes (Signed)
Pt in stable mood. Pt pleasant while talking with staff, minimal interaction with peers, pt states she is feeling better than how she felt in a long while. Pt states she has baseline anxiety of 4/10 and is still a little depressed 5/10. She mentioned she had the best sleep since she was admitted due to the meds she is being given. Denies SI/IH. Denies current AVH. Denies any withdrawal symptoms. Pt did not attend group but states she will try to attend a session.     10/05/21 2015  Psych Admission Type (Psych Patients Only)  Admission Status Voluntary  Psychosocial Assessment  Patient Complaints Anxiety  Eye Contact Fair  Facial Expression Flat  Affect Anxious  Speech Logical/coherent  Interaction Assertive  Motor Activity Other (Comment) (WNL)  Appearance/Hygiene Unremarkable  Behavior Characteristics Cooperative;Appropriate to situation;Anxious  Mood Anxious;Pleasant  Thought Process  Coherency WDL  Content WDL  Delusions None reported or observed  Perception WDL  Hallucination None reported or observed  Judgment WDL  Confusion None  Danger to Self  Current suicidal ideation? Denies  Danger to Others  Danger to Others None reported or observed

## 2021-10-05 NOTE — Progress Notes (Addendum)
[facility] MD Progress Note  10/05/2021 6:08 PM Ashley Bautista  MRN:  [id number] Subjective:  Ashley Bautista is a 35 year old female female with a documented psychiatric history of MDD and EtoH use disorder and a reported psychiatric history of bipolar disorder who presented to [facility] and was transferred to [facility] voluntarily after endorsing SI w/ plan.  Chart Review of Past 24 hrs: The patient's chart was reviewed and nursing notes were reviewed. The patient's case was discussed in multidisciplinary team meeting.  Per MAR: - Patient is compliant with scheduled meds. - PRNs: Hydroxyzine x1 Per RN notes, no documented behavioral issues and is attending group. Patient sleep hours not documented  Patient had the following psychiatric recommendations yesterday:  -Initiate Zoloft 25 mg - Ativan Detox Protocol with scheduled Ativan taper and Ativan 1mg  for CIWA score >10; oral MVI and thiamine replacement - Start Gabapentin 100mg  TID to help with cravings, potential withdrawal and anxiety  On Today's Assessment (10/05/2021): Case was discussed in the multidisciplinary team. MAR was reviewed and patient was compliant with medications. Patient seen, assessed, and discussed with attending Dr. Nelda Marseille.  She reports that she is doing well today, slept well last night, and appetite is good.  She reports no SI, HI, AVH, first rank symptoms, ideas of reference, or paranoia on the unit.  She does report a bit of increased anxiety today, but denies cravings.  We discussed her alcohol use extensively, and she was able to acknowledge the problem that it caused in her relationships.  Our team informed her that alcohol is a depressant, so it may be contributing to her mood symptoms.  She voiced understanding, but does not quite feel ready for the discussion of treatment options.  We advised that we will continue to talk during her admission, and she was agreeable.  Principal Problem: MDD (major depressive disorder),  recurrent severe, without psychosis ([place]) Diagnosis: Principal Problem:   MDD (major depressive disorder), recurrent severe, without psychosis ([place]) Active Problems:   Alcohol use disorder, severe, dependence ([place])   Cannabis abuse  Total Time spent with patient: I personally spent 35 minutes on the unit in direct patient care. The direct patient care time included face-to-face time with the patient, reviewing the patient's chart, communicating with other professionals, and coordinating care. Greater than 50% of this time was spent in counseling or coordinating care with the patient regarding goals of hospitalization, psycho-education, and discharge planning needs.   Past Psychiatric History: See H&P  Past Medical History: see H&P  Family History: see H&P  Family Psychiatric  History: See H&P  Social History:  Social History   Substance and Sexual Activity  Alcohol Use Yes   Comment: 1/5th liquor daily     Social History   Substance and Sexual Activity  Drug Use Yes   Types: Marijuana    Social History   Socioeconomic History   Marital status: Single    Spouse name: Not on file   Number of children: Not on file   Years of education: Not on file   Highest education level: Not on file  Occupational History   Not on file  Tobacco Use   Smoking status: Former   Smokeless tobacco: Never  Vaping Use   Vaping Use: Every day   Substances: Nicotine, Flavoring  Substance and Sexual Activity   Alcohol use: Yes    Comment: 1/5th liquor daily   Drug use: Yes    Types: Marijuana   Sexual activity: Not on file  Other Topics  Concern   Not on file  Social History Narrative   Not on file   Social Determinants of Health   Financial Resource Strain: Not on file  Food Insecurity: Not on file  Transportation Needs: Not on file  Physical Activity: Not on file  Stress: Not on file  Social Connections: Not on file    Sleep: Good  Appetite:  Good  Current  Medications: Current Facility-Administered Medications  Medication Dose Route Frequency Provider Last Rate Last Admin   alum & mag hydroxide-simeth (MAALOX/MYLANTA) 200-200-20 MG/5ML suspension 30 mL  30 mL Oral Q4H PRN Rankin, Shuvon B, NP       benzocaine (ORAJEL) 10 % mucosal gel   Mouth/Throat TID PRN Melbourne Abts W, PA-C       gabapentin (NEURONTIN) capsule 200 mg  200 mg Oral TID Lamar Sprinkles, MD   200 mg at 10/05/21 1727   hydrOXYzine (ATARAX) tablet 25 mg  25 mg Oral Q6H PRN Ardis Hughs, NP   25 mg at 10/04/21 0802   loperamide (IMODIUM) capsule 2-4 mg  2-4 mg Oral PRN Ardis Hughs, NP       LORazepam (ATIVAN) tablet 1 mg  1 mg Oral Q6H PRN Ardis Hughs, NP       LORazepam (ATIVAN) tablet 1 mg  1 mg Oral QID Mason Jim, Labria Wos E, MD   1 mg at 10/05/21 1727   Followed by   Melene Muller ON 10/06/2021] LORazepam (ATIVAN) tablet 1 mg  1 mg Oral TID Comer Locket, MD       Followed by   Melene Muller ON 10/07/2021] LORazepam (ATIVAN) tablet 1 mg  1 mg Oral BID Comer Locket, MD       Followed by   Melene Muller ON 10/08/2021] LORazepam (ATIVAN) tablet 1 mg  1 mg Oral Daily Mason Jim, Latoi Giraldo E, MD       multivitamin with minerals tablet 1 tablet  1 tablet Oral Daily Ardis Hughs, NP   1 tablet at 10/05/21 0817   ondansetron (ZOFRAN-ODT) disintegrating tablet 4 mg  4 mg Oral Q6H PRN Ardis Hughs, NP       sertraline (ZOLOFT) tablet 25 mg  25 mg Oral Daily Eliseo Gum B, MD   25 mg at 10/05/21 4259   thiamine tablet 100 mg  100 mg Oral Daily Ardis Hughs, NP   100 mg at 10/05/21 5638    Lab Results:  Results for orders placed or performed during the hospital encounter of 10/04/21 (from the past 48 hour(s))  Hepatitis panel, acute     Status: None   Collection Time: 10/04/21  6:55 PM  Result Value Ref Range   Hepatitis B Surface Ag NON REACTIVE NON REACTIVE   HCV Ab NON REACTIVE NON REACTIVE    Comment: (NOTE) Nonreactive HCV antibody screen is consistent with no HCV  infections,  unless recent infection is suspected or other evidence exists to indicate HCV infection.     Hep A IgM NON REACTIVE NON REACTIVE   Hep B C IgM NON REACTIVE NON REACTIVE    Comment: Performed at [facility], [address], [place], [place] [address]  Hepatic function panel     Status: Abnormal   Collection Time: 10/04/21  6:55 PM  Result Value Ref Range   Total Protein 6.7 6.5 - 8.1 g/dL   Albumin 4.2 3.5 - 5.0 g/dL   AST 36 15 - 41 U/L   ALT 24 0 - 44  U/L   Alkaline Phosphatase 47 38 - 126 U/L   Total Bilirubin 1.8 (H) 0.3 - 1.2 mg/dL   Bilirubin, Direct 0.3 (H) 0.0 - 0.2 mg/dL   Indirect Bilirubin 1.5 (H) 0.3 - 0.9 mg/dL    Comment: Performed at [facility], [place] [address], [place], [place] [address]    Blood Alcohol level:  Lab Results  Component Value Date   ETH 217 (H) 123XX123    Metabolic Disorder Labs: Lab Results  Component Value Date   HGBA1C 4.7 (L) 10/03/2021   MPG 88.19 10/03/2021   No results found for: PROLACTIN Lab Results  Component Value Date   CHOL 146 10/03/2021   TRIG 53 10/03/2021   HDL 92 10/03/2021   CHOLHDL 1.6 10/03/2021   VLDL 11 10/03/2021   LDLCALC 43 10/03/2021    Physical Findings: AIMS: Facial and Oral Movements Muscles of Facial Expression: None, normal Lips and Perioral Area: None, normal Jaw: None, normal Tongue: None, normal,Extremity Movements Upper (arms, wrists, hands, fingers): None, normal Lower (legs, knees, ankles, toes): None, normal, Trunk Movements Neck, shoulders, hips: None, normal, Overall Severity Severity of abnormal movements (highest score from questions above): None, normal Incapacitation due to abnormal movements: None, normal Patient's awareness of abnormal movements (rate only patient's report): No Awareness, Dental Status Current problems with teeth and/or dentures?: No Does patient usually wear dentures?: No  CIWA:  CIWA-Ar Total: 3     Musculoskeletal: Strength & Muscle Tone: within normal limits Gait & Station: normal, steady Patient leans: N/A  Psychiatric Specialty Exam:  Presentation  General Appearance: Appropriate for Environment; Casual  Eye Contact:Good  Speech:Clear and Coherent  Speech Volume:Normal  Handedness:No data recorded  Mood and Affect  Mood:described as improved - appears less dysphoric  Affect:constricted   Thought Process  Thought Processes:Coherent, goal directed  Descriptions of Associations:Intact  Orientation:Full (Time, Place and Person)  Thought Content:Logical - denies AVH, paranoia, or delusions  History of Schizophrenia/Schizoaffective disorder:No  Duration of Psychotic Symptoms:NA Hallucinations:Hallucinations: None  Ideas of Reference:None  Suicidal Thoughts:Suicidal Thoughts: No  Homicidal Thoughts:Homicidal Thoughts: No   Sensorium  Memory:Immediate Good; Recent Good; Remote Good  Judgment:Fair  Insight:Shallow; Fair (Improving)   Executive Functions  Concentration:Good  Attention Span:Good  [place] of Knowledge:Good  Language:Good   Psychomotor Activity  Psychomotor Activity:Psychomotor Activity: Normal   Assets  Assets:Communication Skills; Housing; Resilience; Social Support   Sleep  Sleep:hours note documented   Physical Exam Vitals and nursing note reviewed.  Constitutional:      General: She is not in acute distress.    Appearance: Normal appearance. She is not toxic-appearing or diaphoretic.  HENT:     Head: Normocephalic and atraumatic.  Pulmonary:     Effort: Pulmonary effort is normal.  Neurological:     General: No focal deficit present.     Mental Status: She is alert.     Motor: No weakness.     Gait: Gait normal.   Review of Systems  Constitutional:  Negative for chills, diaphoresis and malaise/fatigue.  HENT:  Negative for congestion.   Respiratory:  Negative for shortness of breath.    Cardiovascular:  Negative for chest pain.  Gastrointestinal: Negative.   Genitourinary: Negative.   Musculoskeletal: Negative.   Neurological:  Negative for dizziness, tingling, tremors, weakness and headaches.  Blood pressure 111/85, pulse 65, temperature 98.3 F (36.8 C), temperature source Oral, resp. rate 18, height 5\' 6"  (1.676 m), weight 65.8 kg, SpO2 100 %. Body mass index  is 23.4 kg/m.   Treatment Plan Summary: Daily contact with patient to assess and evaluate symptoms and progress in treatment and Medication management Ashley Bautista is a 35 yo patient who appears to present with symptoms congruent with MDD, recurrent, severe. Patient also appears to suffer from EtOH use disorder. Patient EtoH was elevated but patient endorses  no hx of withdrawal; however will continue to monitor patient and encourage patient to consider rehab or assistance with sobriety. Regarding patient's mood it appears unlikely that patient truly has Bipolar disorder and this prior dx was likely clouded by substance use.    Reviewed Labs: EtOH 217, HgbA1c- 4.7, HIV ab (-), Lipid panel WNL, UA- does not indicate UTI, U Preg (-), UDS (+ THC), GC/Chlamydia- pending EKG- QTC 416 (12/6)   MDD, recurrent, severe w/o psychosis (r/o Substance induced mood disorder) -Continue Zoloft 25mg  for depressive symptoms   Alcohol use d/o - Ativan Detox Protocol with scheduled Ativan taper and Ativan 1mg  for CIWA score >10; oral MVI and thiamine replacement (recent CIWA scores 0,3) -Increase gabapentin to 200mg  TID to help with cravings, potential withdrawal and anxiety - titrating up as toelrated   Elevated AST - resolved - Acute hepatitis panel nonreactive - Repeat LFTS show AST down to 36 but has elevated total bili 1.8, direct bili 0.3, and indirect bili 1.5 - will need monitoring as an outpatient    Safety: Recommend routine q 15min checks.    Rosezetta Schlatter, MD 10/05/2021, 6:08 PM

## 2021-10-05 NOTE — [facility] Group Notes (Signed)
Patient did not attend the relaxation group. 

## 2021-10-05 NOTE — Progress Notes (Signed)
Ashley Bautista reported that her day was good.  She was able to get our of her room more.  Spent much of the evening in the day room.  She denied SI/HI or AVH.  She took her hs medications without difficulty.  No withdrawal symptoms noted.  Q 15 minute checks maintained for safety.   10/04/21 2143  Psych Admission Type (Psych Patients Only)  Admission Status Voluntary  Psychosocial Assessment  Patient Complaints Anxiety;Depression  Eye Contact Fair  Facial Expression Sad  Affect Sad  Speech Logical/coherent  Interaction Assertive  Motor Activity Other (Comment) (unremarkable)  Appearance/Hygiene Unremarkable  Behavior Characteristics Cooperative;Appropriate to situation  Mood Depressed;Pleasant  Thought Process  Coherency WDL  Content WDL  Delusions WDL  Perception WDL  Hallucination None reported or observed  Judgment WDL  Confusion None  Danger to Self  Current suicidal ideation? Denies  Danger to Others  Danger to Others None reported or observed

## 2021-10-05 NOTE — B[facility] Suicide Risk Assessment (Signed)
[facility] INPATIENT:  Family/Significant Other Suicide Prevention Education  Suicide Prevention Education:  Education Completed; Shanyla Marconi [phone] (Mother) has been identified by the patient as the family member/significant other with whom the patient will be residing, and identified as the person(s) who will aid the patient in the event of a mental health crisis (suicidal ideations/suicide attempt).  With written consent from the patient, the family member/significant other has been provided the following suicide prevention education, prior to the and/or following the discharge of the patient.  The suicide prevention education provided includes the following: Suicide risk factors Suicide prevention and interventions National Suicide Hotline telephone number [facility] assessment telephone number [facility] Emergency Assistance 911 [facility] and/or Residential Mobile Crisis Unit telephone number  Request made of family/significant other to: Remove weapons (e.g., guns, rifles, knives), all items previously/currently identified as safety concern.   Remove drugs/medications (over-the-counter, prescriptions, illicit drugs), all items previously/currently identified as a safety concern.  The family member/significant other verbalizes understanding of the suicide prevention education information provided.  The family member/significant other agrees to remove the items of safety concern listed above.  CSW spoke with Mrs. Wacha who states that her daughter got into an argument with her partner while intoxicated and stated to her that she was hearing voices.  Mrs. Deleo states that her daughter's partner has only been living with her daughter for 1 month and it has not been a healthy relationship.  She states that her daughter moved to [facility] in April of this year and she has notcied that her daughter has been drinking heavily since then.  She states that her daughter's  drinking has increased in the last few months and she also feels that her depression has increased as well.  She states that there are no firearms or weapons in her daughter's home.  Mrs. Lahmann states that she will continue to be a support for her daughter and will be coming to visit her tonight during visitation hours.  CSW completed SPE with Mrs. Butsch.   Metro Kung Herminio Kniskern 10/05/2021, 11:33 AM

## 2021-10-05 NOTE — Progress Notes (Signed)
Pt denies SI/HI/AVH and verbally agrees to approach staff if these become apparent or before harming themselves/others. Rates depression 3/10. Rates anxiety 3/10. Rates pain 0/10. Pt has been in room or sleeping for a lot of the day. RN went to wake up for lunch and meds and she was hard to arouse. Pt was asked if its normal that she sleep that hard and she said no but that she hasn't had much sleep in a long time. Pts goal is to "try to make a group" and to "listen to what they have to say." Pt has motivation for improvement but needs encouragement. Scheduled medications administered to Pt, per MD orders. RN provided support and encouragement to Pt. Q15 min safety checks implemented and continued. Pt safe on the unit. RN will continue to monitor and intervene as needed.   10/05/21 0817  Psych Admission Type (Psych Patients Only)  Admission Status Voluntary  Psychosocial Assessment  Patient Complaints Anxiety;Depression  Eye Contact Fair  Facial Expression Sad;Flat  Affect Sad  Speech Logical/coherent  Interaction Assertive  Motor Activity Other (Comment) (wdl)  Appearance/Hygiene Unremarkable  Behavior Characteristics Cooperative;Appropriate to situation  Mood Depressed;Pleasant  Thought Process  Coherency WDL  Content WDL  Delusions None reported or observed  Perception WDL  Hallucination None reported or observed  Judgment WDL  Confusion None  Danger to Self  Current suicidal ideation? Denies  Danger to Others  Danger to Others None reported or observed

## 2021-10-05 NOTE — BHH Counselor (Signed)
CSW provided the Pt with a packet that contained information about AA meetings in [facility], outpatient providers that provide substance use services, and inpatient treatment centers across [facility].

## 2021-10-06 MED ORDER — WHITE PETROLATUM EX OINT
TOPICAL_OINTMENT | CUTANEOUS | Status: AC
  Filled 2021-10-06: qty 5

## 2021-10-06 MED ORDER — IBUPROFEN 600 MG PO TABS
600.0000 mg | ORAL_TABLET | Freq: Four times a day (QID) | ORAL | Status: DC | PRN
  Administered 2021-10-06 – 2021-10-08 (×3): 600 mg via ORAL
  Filled 2021-10-06 (×3): qty 1

## 2021-10-06 NOTE — Progress Notes (Addendum)
[facility] MD Progress Note  10/06/2021 1:30 PM Ashley Bautista  MRN:  [id number] Subjective:  Ashley Bautista is a 35 year old female female with a documented psychiatric history of MDD and EtoH use disorder and a reported psychiatric history of bipolar disorder who presented to [facility] and was transferred to [facility] voluntarily after endorsing SI w/ plan.  Chart Review of Past 24 hrs: The patient's chart was reviewed and nursing notes were reviewed. The patient's case was discussed in multidisciplinary team meeting.  Per MAR: - Patient is compliant with scheduled meds. - PRNs: Hydroxyzine x1 Per RN notes, no documented behavioral issues and is attending group. Patient sleep hours not documented  Patient had the following psychiatric recommendations yesterday:  -Continue Zoloft 25 mg - Ativan Detox Protocol with scheduled Ativan taper and Ativan 1mg  for CIWA score >10; oral MVI and thiamine replacement (Ends 12/11) - Increase to Gabapentin 200mg  TID to help with cravings, potential withdrawal and anxiety  On Today's Assessment (10/06/2021): Case was discussed in the multidisciplinary team. MAR was reviewed and patient was compliant with medications. Patient seen, assessed, and discussed with attending Dr. Nelda Marseille.  She reports that she is doing well today, slept well last night, and appetite is good.  She reports no SI, HI, AVH, first rank symptoms, ideas of reference, or paranoia on the unit.  She reports significant improvement in her anxiety today, and continues to deny cravings.  We again discussed her alcohol use treatment options, and she endorsed that she would like to attend AA meetings. She also endorsed no longer feeling the desire to use her vape pen. We discussed that once her Ativan taper is complete, we can plan for discharge, and she was agreeable. She does not want to make adjustments to her SSRI at this time.   Principal Problem: MDD (major depressive disorder), recurrent severe, without  psychosis ([place]) Diagnosis: Principal Problem:   MDD (major depressive disorder), recurrent severe, without psychosis ([place]) Active Problems:   Alcohol use disorder, severe, dependence ([place])   Cannabis abuse  Total Time spent with patient: I personally spent 35 minutes on the unit in direct patient care. The direct patient care time included face-to-face time with the patient, reviewing the patient's chart, communicating with other professionals, and coordinating care. Greater than 50% of this time was spent in counseling or coordinating care with the patient regarding goals of hospitalization, psycho-education, and discharge planning needs.   Past Psychiatric History: See H&P  Past Medical History: see H&P  Family History: see H&P  Family Psychiatric  History: See H&P  Social History:  Social History   Substance and Sexual Activity  Alcohol Use Yes   Comment: 1/5th liquor daily     Social History   Substance and Sexual Activity  Drug Use Yes   Types: Marijuana    Social History   Socioeconomic History   Marital status: Single    Spouse name: Not on file   Number of children: Not on file   Years of education: Not on file   Highest education level: Not on file  Occupational History   Not on file  Tobacco Use   Smoking status: Former   Smokeless tobacco: Never  Vaping Use   Vaping Use: Every day   Substances: Nicotine, Flavoring  Substance and Sexual Activity   Alcohol use: Yes    Comment: 1/5th liquor daily   Drug use: Yes    Types: Marijuana   Sexual activity: Not on file  Other Topics Concern  Not on file  Social History Narrative   Not on file   Social Determinants of Health   Financial Resource Strain: Not on file  Food Insecurity: Not on file  Transportation Needs: Not on file  Physical Activity: Not on file  Stress: Not on file  Social Connections: Not on file    Sleep: Good  Appetite:  Good  Current Medications: Current  Facility-Administered Medications  Medication Dose Route Frequency Provider Last Rate Last Admin   alum & mag hydroxide-simeth (MAALOX/MYLANTA) 200-200-20 MG/5ML suspension 30 mL  30 mL Oral Q4H PRN Rankin, Shuvon B, NP       benzocaine (ORAJEL) 10 % mucosal gel   Mouth/Throat TID PRN Jaclyn Shaggyaylor, Cody W, PA-C   1 application at 10/05/21 2338   gabapentin (NEURONTIN) capsule 200 mg  200 mg Oral TID Lamar Sprinklesosby, Courtney, MD   200 mg at 10/06/21 1320   hydrOXYzine (ATARAX) tablet 25 mg  25 mg Oral Q6H PRN Ardis Hughsoleman, Carolyn H, NP   25 mg at 10/04/21 0802   ibuprofen (ADVIL) tablet 600 mg  600 mg Oral Q6H PRN Jackelyn PolingBerry, Jason A, NP   600 mg at 10/06/21 0117   loperamide (IMODIUM) capsule 2-4 mg  2-4 mg Oral PRN Ardis Hughsoleman, Carolyn H, NP       LORazepam (ATIVAN) tablet 1 mg  1 mg Oral Q6H PRN Ardis Hughsoleman, Carolyn H, NP       LORazepam (ATIVAN) tablet 1 mg  1 mg Oral TID Bartholomew CrewsSingleton, Kendre Sires E, MD   1 mg at 10/06/21 1321   Followed by   Melene Muller[START ON 10/07/2021] LORazepam (ATIVAN) tablet 1 mg  1 mg Oral BID Comer LocketSingleton, Yuritzy Zehring E, MD       Followed by   Melene Muller[START ON 10/08/2021] LORazepam (ATIVAN) tablet 1 mg  1 mg Oral Daily Mason JimSingleton, Jazarah Capili E, MD       multivitamin with minerals tablet 1 tablet  1 tablet Oral Daily Ardis Hughsoleman, Carolyn H, NP   1 tablet at 10/06/21 0826   ondansetron (ZOFRAN-ODT) disintegrating tablet 4 mg  4 mg Oral Q6H PRN Ardis Hughsoleman, Carolyn H, NP       sertraline (ZOLOFT) tablet 25 mg  25 mg Oral Daily Eliseo GumMcQuilla, Jai B, MD   25 mg at 10/06/21 0827   thiamine tablet 100 mg  100 mg Oral Daily Ardis Hughsoleman, Carolyn H, NP   100 mg at 10/06/21 [id number]    Lab Results:  Results for orders placed or performed during the hospital encounter of 10/04/21 (from the past 48 hour(s))  Hepatitis panel, acute     Status: None   Collection Time: 10/04/21  6:55 PM  Result Value Ref Range   Hepatitis B Surface Ag NON REACTIVE NON REACTIVE   HCV Ab NON REACTIVE NON REACTIVE    Comment: (NOTE) Nonreactive HCV antibody screen is consistent with no HCV  infections,  unless recent infection is suspected or other evidence exists to indicate HCV infection.     Hep A IgM NON REACTIVE NON REACTIVE   Hep B C IgM NON REACTIVE NON REACTIVE    Comment: Performed at [facility], [address] [place], [place] [address]  Hepatic function panel     Status: Abnormal   Collection Time: 10/04/21  6:55 PM  Result Value Ref Range   Total Protein 6.7 6.5 - 8.1 g/dL   Albumin 4.2 3.5 - 5.0 g/dL   AST 36 15 - 41 U/L   ALT 24 0 - 44 U/L  Alkaline Phosphatase 47 38 - 126 U/L   Total Bilirubin 1.8 (H) 0.3 - 1.2 mg/dL   Bilirubin, Direct 0.3 (H) 0.0 - 0.2 mg/dL   Indirect Bilirubin 1.5 (H) 0.3 - 0.9 mg/dL    Comment: Performed at [facility], [address], [place], [place] [address]    Blood Alcohol level:  Lab Results  Component Value Date   ETH 217 (H) 10/03/2021    Metabolic Disorder Labs: Lab Results  Component Value Date   HGBA1C 4.7 (L) 10/03/2021   MPG 88.19 10/03/2021   No results found for: PROLACTIN Lab Results  Component Value Date   CHOL 146 10/03/2021   TRIG 53 10/03/2021   HDL 92 10/03/2021   CHOLHDL 1.6 10/03/2021   VLDL 11 10/03/2021   LDLCALC 43 10/03/2021    Physical Findings: AIMS: Facial and Oral Movements Muscles of Facial Expression: None, normal Lips and Perioral Area: None, normal Jaw: None, normal Tongue: None, normal,Extremity Movements Upper (arms, wrists, hands, fingers): None, normal Lower (legs, knees, ankles, toes): None, normal, Trunk Movements Neck, shoulders, hips: None, normal, Overall Severity Severity of abnormal movements (highest score from questions above): None, normal Incapacitation due to abnormal movements: None, normal Patient's awareness of abnormal movements (rate only patient's report): No Awareness, Dental Status Current problems with teeth and/or dentures?: No Does patient usually wear dentures?: No  CIWA:  CIWA-Ar Total: 0     Musculoskeletal: Strength & Muscle Tone: within normal limits Gait & Station: normal, steady Patient leans: N/A  Psychiatric Specialty Exam:  Presentation  General Appearance: Appropriate for Environment; Casual  Eye Contact:Good  Speech:Clear and Coherent  Speech Volume:Normal  Handedness:No data recorded  Mood and Affect  Mood:more euthymic appearing  Affect:Full range; pleasant   Thought Process  Thought Processes:Coherent; Linear linear, goal directed (future-oriented)  Descriptions of Associations:Intact  Orientation:Full (Time, Place and Person)  Thought Content:Logical  - denies AVH, paranoia, or delusions  History of Schizophrenia/Schizoaffective disorder:No  Duration of Psychotic Symptoms:NA Hallucinations:Hallucinations: None  Ideas of Reference:None  Suicidal Thoughts:Suicidal Thoughts: No  Homicidal Thoughts:Homicidal Thoughts: No   Sensorium  Memory:Immediate Good; Recent Good; Remote Good  Judgment:Fair (Improving)  Insight:Fair (Improving)   Executive Functions  Concentration:Good  Attention Span:Good  Recall:Good  Fund of Knowledge:Good  Language:Good   Psychomotor Activity  Psychomotor Activity:Psychomotor Activity: Normal   Assets  Assets:Communication Skills; Desire for Improvement; Housing; Resilience; Social Support   Sleep  Sleep:hours note documented   Physical Exam Vitals and nursing note reviewed.  Constitutional:      General: She is not in acute distress.    Appearance: Normal appearance. She is not toxic-appearing or diaphoretic.  HENT:     Head: Normocephalic and atraumatic.  Pulmonary:     Effort: Pulmonary effort is normal.  Neurological:     General: No focal deficit present.     Mental Status: She is alert.     Motor: No weakness.     Gait: Gait normal.   Review of Systems  Constitutional:  Negative for chills, diaphoresis and malaise/fatigue.  HENT:  Negative for congestion.    Respiratory:  Negative for shortness of breath.   Cardiovascular:  Negative for chest pain.  Gastrointestinal: Negative.   Genitourinary: Negative.   Musculoskeletal: Negative.   Neurological:  Negative for dizziness, tingling, tremors, weakness and headaches.  Blood pressure (!) 111/92, pulse 94, temperature 98.7 F (37.1 C), temperature source Oral, resp. rate 18, height 5\' 6"  (1.676 m), weight 65.8 kg, SpO2 97 %.  Body mass index is 23.4 kg/m.   Treatment Plan Summary: Daily contact with patient to assess and evaluate symptoms and progress in treatment and Medication management Devlynn Knoff is a 35 yo patient who appears to present with symptoms congruent with MDD, recurrent, severe. Patient also appears to suffer from EtOH use disorder. Patient EtoH was elevated but patient endorses  no hx of withdrawal; however will continue to monitor patient and encourage patient to consider rehab or assistance with sobriety. Regarding patient's mood it appears unlikely that patient truly has Bipolar disorder and this prior dx was likely clouded by substance use.    Reviewed Labs: EtOH 217, HgbA1c- 4.7, HIV ab (-), Lipid panel WNL, UA- does not indicate UTI, U Preg (-), UDS (+ THC), GC/Chlamydia- pending EKG- QTC 416 (12/6)   MDD, recurrent, severe w/o psychosis (r/o Substance induced mood disorder) -Continue Zoloft 25mg  for depressive symptoms   Alcohol use d/o - Ativan Detox Protocol with scheduled Ativan taper and Ativan 1mg  for CIWA score >10; oral MVI and thiamine replacement (recent CIWA scores 0,0,0) (ends 12/11) -Continue gabapentin 200mg  TID to help with cravings, potential withdrawal and anxiety  Elevated AST - resolved - Acute hepatitis panel nonreactive - Repeat LFTS show AST down to 36 but has elevated total bili 1.8, direct bili 0.3, and indirect bili 1.5 - will need monitoring as an outpatient and patient made aware   Safety: Recommend routine q checks.    Discharge  Planning:              -- Social work and case management to assist with discharge planning and identification of hospital follow-up needs prior to discharge.              -- Estimated DoD: 12/12             -- Discharge Concerns: Need to establish a safety plan; Medication compliance and effectiveness; Alcohol use treatment resources             -- Discharge Goals: Return home with outpatient referrals for mental health follow-up including medication management/psychotherapy   14/11, MD 10/06/2021, 1:30 PM

## 2021-10-06 NOTE — Group Note (Signed)
LCSW Group Therapy Note   Group Date: 10/06/2021 Start Time: 1300 End Time: 1400  Topic: Coping Skills  Due to the acuity and a high number of new admissions, group was not held. Patient was provided therapeutic worksheets and asked to meet with CSW as needed.   Felizardo Hoffmann, LCSWA 10/06/2021  12:56 PM

## 2021-10-06 NOTE — Progress Notes (Signed)
Patient did attend the evening speaker AA meeting.  

## 2021-10-06 NOTE — Plan of Care (Signed)
Patient has been active in the milieu, calm and cooperative. No withdrawal symptoms. Patient reported that she has high tolerance to alcohol. She has been participating in group activities with no sign of distress. Appetite and sleep improved. Pt denied thoughts of self-harm. Denied AVH. Encouragements and support provided. Safety precautions maintained.

## 2021-10-06 NOTE — Group Note (Signed)
Recreation Therapy Group Note   Group Topic:Stress Management  Group Date: 10/06/2021 Start Time: 0930 End Time: 0949 Facilitators: Caroll Rancher, [place] Location: [address] Dayroom   Goal Area(s) Addresses:  Patient will identify positive stress management techniques. Patient will identify benefits of using stress management post d/c.  Group Description:  Meditation.  LRT played a meditation that focused on setting boundaries with the people around you.  Meditation emphasized the point of saying no to things and the wishes of others is ok in order to put your feelings and desires first.     Affect/Mood: Appropriate   Participation Level: Active   Participation Quality: Independent   Behavior: Appropriate   Speech/Thought Process: Focused   Insight: Good   Judgement: Good   Modes of Intervention: Meditation   Patient Response to Interventions:  Engaged   Education Outcome:  Acknowledges education and In group clarification offered    Clinical Observations/Individualized Feedback: Pt attended and participated in group.    Plan: Continue to engage patient in RT group sessions 2-3x/week.   Caroll Rancher, LRT,CTRS 10/06/2021 11:11 AM

## 2021-10-06 NOTE — Progress Notes (Signed)
D: Pt complaining of tooth pain rated 8/10 that has woken her up. Dental caries noted in mouth. No swelling, or bleeding gums noticeable. Airway patent. Mucous membrane pink and moist.   A: Provider paged and made aware, meds ordered. PRN Orajel and ibuprofen given for pain.   R: Pain reduced and patient was able to return back to sleep.

## 2021-10-07 MED ORDER — HYDROXYZINE HCL 25 MG PO TABS
25.0000 mg | ORAL_TABLET | Freq: Three times a day (TID) | ORAL | Status: DC | PRN
  Administered 2021-10-08: 25 mg via ORAL
  Filled 2021-10-07: qty 1

## 2021-10-07 NOTE — Group Note (Signed)
LCSW Group Therapy Note  10/07/2021    10:00-11:00am   Type of Therapy and Topic:  Group Therapy: Early Messages Received About Anger  Participation Level:  Active   Description of Group:   In this group, patients shared and discussed the early messages received in their lives about anger through parental or other adult modeling, teaching, repression, punishment, violence, and more.  Participants identified how those childhood lessons influence even now how they usually or often react when angered.  The group discussed that anger is a secondary emotion and what may be the underlying emotional themes that come out through anger outbursts or that are ignored through anger suppression.    Therapeutic Goals: Patients will identify one or more childhood message about anger that they received and how it was taught to them. Patients will discuss how these childhood experiences have influenced and continue to influence their own expression or repression of anger even today. Patients will explore possible primary emotions that tend to fuel their secondary emotion of anger. Patients will learn that anger itself is normal and cannot be eliminated, and that healthier coping skills can assist with resolving conflict rather than worsening situations.  Summary of Patient Progress:  The patient shared that their childhood lessons about anger were that it was embarrassing to get angry.  As a result, they now withhold their emotions until they "explode" out.  The patient participated fully and demonstrated insight.  Therapeutic Modalities:   Cognitive Behavioral Therapy Motivation Interviewing  [place], [place] 10/07/2021 12:24 PM

## 2021-10-07 NOTE — Progress Notes (Signed)
The patient rated her day as a 8 out of 10 since she had more "fun" today. Her goal for tomorrow is to have more fun than she did today.

## 2021-10-07 NOTE — Progress Notes (Signed)
   10/07/21 1200  Psych Admission Type (Psych Patients Only)  Admission Status Voluntary  Psychosocial Assessment  Patient Complaints None  Eye Contact Fair  Facial Expression Animated  Affect Appropriate to circumstance  Speech Logical/coherent  Interaction Assertive  Motor Activity Other (Comment) (wnl)  Appearance/Hygiene Unremarkable  Behavior Characteristics Cooperative  Mood Pleasant  Thought Process  Coherency WDL  Content WDL  Delusions None reported or observed  Perception WDL  Hallucination None reported or observed  Judgment WDL  Confusion None  Danger to Self  Current suicidal ideation? Denies  Danger to Others  Danger to Others None reported or observed

## 2021-10-07 NOTE — Progress Notes (Addendum)
[facility] MD Progress Note  10/07/2021 7:07 AM Ashley Bautista  MRN:  [id number] Subjective:  Ashley Bautista is a 35 year old female female with a documented psychiatric history of MDD and EtoH use disorder and a reported psychiatric history of bipolar disorder who presented to [facility] and was transferred to [facility] voluntarily after endorsing SI w/ plan.  Chart Review of Past 24 hrs: The patient's chart was reviewed and nursing notes were reviewed. The patient's case was discussed in multidisciplinary team meeting.  Per MAR: - Patient is compliant with scheduled meds. - PRNs: Hydroxyzine x1 Per RN notes, no documented behavioral issues and is attending group. Patient sleep hours not documented  Patient had the following psychiatric recommendations yesterday:  -Continue Zoloft 25 mg - Ativan Detox Protocol with scheduled Ativan taper and Ativan 1mg  for CIWA score >10; oral MVI and thiamine replacement (Ends 12/11) - Increase to Gabapentin 200mg  TID to help with cravings, potential withdrawal and anxiety  On Today's Assessment (10/07/2021): Case was discussed in the multidisciplinary team. MAR was reviewed and patient was compliant with medications. Patient seen, assessed, and discussed with attending Dr. Nelda Marseille.  She remains stable, reporting that she is doing well today and appetite is good.  She did have some difficulty sleeping, as her roommate had nightmares and was calling out to her. She reports no SI, HI, AVH, first rank symptoms, ideas of reference, or paranoia on the unit.  She continues to deny cravings and physical withdrawal symptoms.  We again discussed that once her Ativan taper is complete, we can plan for discharge (on Monday), and she was agreeable. She does not want to make adjustments to her SSRI at this time.   Principal Problem: MDD (major depressive disorder), recurrent severe, without psychosis ([place]) Diagnosis: Principal Problem:   MDD (major depressive disorder), recurrent  severe, without psychosis ([place]) Active Problems:   Alcohol use disorder, severe, dependence ([place])   Cannabis abuse  Total Time spent with patient: I personally spent 20 minutes on the unit in direct patient care. The direct patient care time included face-to-face time with the patient, reviewing the patient's chart, communicating with other professionals, and coordinating care. Greater than 50% of this time was spent in counseling or coordinating care with the patient regarding goals of hospitalization, psycho-education, and discharge planning needs.   Past Psychiatric History: See H&P  Past Medical History: see H&P  Family History: see H&P  Family Psychiatric  History: See H&P  Social History:  Social History   Substance and Sexual Activity  Alcohol Use Yes   Comment: 1/5th liquor daily     Social History   Substance and Sexual Activity  Drug Use Yes   Types: Marijuana    Social History   Socioeconomic History   Marital status: Single    Spouse name: Not on file   Number of children: Not on file   Years of education: Not on file   Highest education level: Not on file  Occupational History   Not on file  Tobacco Use   Smoking status: Former   Smokeless tobacco: Never  Vaping Use   Vaping Use: Every day   Substances: Nicotine, Flavoring  Substance and Sexual Activity   Alcohol use: Yes    Comment: 1/5th liquor daily   Drug use: Yes    Types: Marijuana   Sexual activity: Not on file  Other Topics Concern   Not on file  Social History Narrative   Not on file   Social Determinants of Health  Financial Resource Strain: Not on file  Food Insecurity: Not on file  Transportation Needs: Not on file  Physical Activity: Not on file  Stress: Not on file  Social Connections: Not on file    Sleep: Good  Appetite:  Good  Current Medications: Current Facility-Administered Medications  Medication Dose Route Frequency Provider Last Rate Last Admin   alum & mag  hydroxide-simeth (MAALOX/MYLANTA) 200-200-20 MG/5ML suspension 30 mL  30 mL Oral Q4H PRN Rankin, Shuvon B, NP       benzocaine (ORAJEL) 10 % mucosal gel   Mouth/Throat TID PRN Jaclyn Shaggy, PA-C   1 application at 10/05/21 2338   gabapentin (NEURONTIN) capsule 200 mg  200 mg Oral TID Lamar Sprinkles, MD   200 mg at 10/06/21 2127   ibuprofen (ADVIL) tablet 600 mg  600 mg Oral Q6H PRN Nira Conn A, NP   600 mg at 10/06/21 0117   LORazepam (ATIVAN) tablet 1 mg  1 mg Oral BID Comer Locket, MD       Followed by   Melene Muller ON 10/08/2021] LORazepam (ATIVAN) tablet 1 mg  1 mg Oral Daily Mason Jim, Chassidy Layson E, MD       multivitamin with minerals tablet 1 tablet  1 tablet Oral Daily Ardis Hughs, NP   1 tablet at 10/06/21 0254   sertraline (ZOLOFT) tablet 25 mg  25 mg Oral Daily Eliseo Gum B, MD   25 mg at 10/06/21 0827   thiamine tablet 100 mg  100 mg Oral Daily Ardis Hughs, NP   100 mg at 10/06/21 0827    Lab Results:  No results found for this or any previous visit (from the past 48 hour(s)).   Blood Alcohol level:  Lab Results  Component Value Date   ETH 217 (H) 10/03/2021    Metabolic Disorder Labs: Lab Results  Component Value Date   HGBA1C 4.7 (L) 10/03/2021   MPG 88.19 10/03/2021   No results found for: PROLACTIN Lab Results  Component Value Date   CHOL 146 10/03/2021   TRIG 53 10/03/2021   HDL 92 10/03/2021   CHOLHDL 1.6 10/03/2021   VLDL 11 10/03/2021   LDLCALC 43 10/03/2021    Physical Findings: AIMS: Facial and Oral Movements Muscles of Facial Expression: None, normal Lips and Perioral Area: None, normal Jaw: None, normal Tongue: None, normal,Extremity Movements Upper (arms, wrists, hands, fingers): None, normal Lower (legs, knees, ankles, toes): None, normal, Trunk Movements Neck, shoulders, hips: None, normal, Overall Severity Severity of abnormal movements (highest score from questions above): None, normal Incapacitation due to abnormal  movements: None, normal Patient's awareness of abnormal movements (rate only patient's report): No Awareness, Dental Status Current problems with teeth and/or dentures?: No Does patient usually wear dentures?: No  CIWA:  CIWA-Ar Total: 0    Musculoskeletal: Strength & Muscle Tone: within normal limits Gait & Station: normal, steady Patient leans: N/A  Psychiatric Specialty Exam:  Presentation  General Appearance: Appropriate for Environment; Casual  Eye Contact:Good  Speech:Clear and Coherent  Speech Volume:Normal  Handedness:No data recorded  Mood and Affect  Mood:more euthymic appearing  Affect:Full range; pleasant - can smile today   Thought Process  Thought Processes:Coherent; Linear goal directed (future-oriented)  Descriptions of Associations:Intact  Orientation:Full (Time, Place and Person)  Thought Content:Logical  - denies AVH, paranoia, or delusions  History of Schizophrenia/Schizoaffective disorder:No  Duration of Psychotic Symptoms:NA Hallucinations:Hallucinations: None  Ideas of Reference:None  Suicidal Thoughts:Suicidal Thoughts: No  Homicidal Thoughts:Homicidal Thoughts: No  Sensorium  Memory:Immediate Good; Recent Good; Remote Good  Judgment: Good  Insight: Good   Executive Functions  Concentration:Good  Attention Span:Good  [place] of Knowledge:Good  Language:Good   Psychomotor Activity  Psychomotor Activity:Psychomotor Activity: Normal   Assets  Assets:Communication Skills; Desire for Improvement; Housing; Resilience; Social Support   Sleep  Sleep:hours not documented   Physical Exam Vitals and nursing note reviewed.  Constitutional:      General: She is not in acute distress.    Appearance: Normal appearance. She is not toxic-appearing or diaphoretic.  HENT:     Head: Normocephalic and atraumatic.  Pulmonary:     Effort: Pulmonary effort is normal.  Neurological:     General: No focal deficit  present.     Mental Status: She is alert.     Motor: No weakness.     Gait: Gait normal.   Review of Systems  Constitutional:  Negative for chills, diaphoresis and malaise/fatigue.  HENT:  Negative for congestion.   Respiratory:  Negative for shortness of breath.   Cardiovascular:  Negative for chest pain.  Gastrointestinal: Negative.   Genitourinary: Negative.   Musculoskeletal: Negative.   Neurological:  Negative for dizziness, tingling, tremors, weakness and headaches.  Blood pressure 108/84, pulse 87, temperature 98.2 F (36.8 C), temperature source Oral, resp. rate 18, height 5\' 6"  (1.676 m), weight 65.8 kg, SpO2 98 %. Body mass index is 23.4 kg/m.   Treatment Plan Summary: Daily contact with patient to assess and evaluate symptoms and progress in treatment and Medication management Arlone Wehrer is a 35 yo patient who appears to present with symptoms congruent with MDD, recurrent, severe. Patient also appears to suffer from EtOH use disorder. Patient EtoH was elevated but patient endorses  no hx of withdrawal; however will continue to monitor patient and encourage patient to consider rehab or assistance with sobriety. Regarding patient's mood it appears unlikely that patient truly has Bipolar disorder and this prior dx was likely clouded by substance use.    Reviewed Labs: EtOH 217, HgbA1c- 4.7, HIV ab (-), Lipid panel WNL, UA- does not indicate UTI, U Preg (-), UDS (+ THC), GC/Chlamydia- pending EKG- QTC 416 (12/6)   MDD, recurrent, severe w/o psychosis (r/o Substance induced mood disorder) -Continue Zoloft 25mg  for depressive symptoms   Alcohol use d/o - Ativan Detox Protocol with scheduled Ativan taper and Ativan 1mg  for CIWA score >10; oral MVI and thiamine replacement (recent CIWA scores 0,0,0) (ends 12/11) -Continue gabapentin 200mg  TID to help with cravings, potential withdrawal and anxiety  Elevated AST - resolved - Acute hepatitis panel nonreactive - Repeat LFTS  show AST down to 36 but has elevated total bili 1.8, direct bili 0.3, and indirect bili 1.5 - will need monitoring as an outpatient and patient made aware   Safety: Recommend routine q 26min checks.    Discharge Planning:              -- Social work and case management to assist with discharge planning and identification of hospital follow-up needs prior to discharge.              -- Estimated DoD: 12/12             -- Discharge Concerns: Need to establish a safety plan; Medication compliance and effectiveness; Alcohol use treatment resources             -- Discharge Goals: Return home with outpatient referrals for mental health follow-up including medication management/psychotherapy  Rosezetta Schlatter, MD 10/07/2021, 7:07 AM

## 2021-10-07 NOTE — [facility] Group Notes (Signed)
.  Psychoeducational Group Note    Date:10/07/2021 Time: 1300-1400    Purpose of Group: . The group focus' on teaching patients on how to identify their needs and their Life Skills:  A group where two lists are made. What people need and what are things that we do that are unhealthy. The lists are developed by the patients and it is explained that we often do the actions that are not healthy to get our list of needs met.  Goal:: to develop the coping skills needed to get their needs met  Participation Level:  Active  Participation Quality:  Appropriate  Affect:  Appropriate  Cognitive:  Oriented  Insight:  Improving  Engagement in Group:  Engaged  Additional Comments:  .Pt rates her energy at a 10/10. Affect and mood are appropriate. Interacted appropriately within the group. Giving and receiving feedback. Paulino Rily

## 2021-10-07 NOTE — [facility] Group Notes (Signed)
Goals Group 10/07/21    Group Focus: affirmation, clarity of thought, and goals/reality orientation Treatment Modality:  Psychoeducation Interventions utilized were assignment, group exercise, and support Purpose: To be able to understand and verbalize the reason for their admission to the hospital. To understand that the medication helps with their chemical imbalance but they also need to work on their choices in life. To be challenged to develop a list of 30 positives about themselves. Also introduce the concept that "feelings" are not reality.  Participation Level:  Active  Participation Quality:  Appropriate  Affect:  Appropriate  Cognitive:  Appropriate  Insight:  Improving  Engagement in Group:  Engaged  Additional Comments:  Pt attending and rating her energy at a 9/10. Participated. Giving and receiving feedback   Dione Housekeeper

## 2021-10-07 NOTE — Progress Notes (Signed)
Pt in stable mood. Pt interacting positively with staff. Pt denies SI/HI. Denies AVH. Pt states she has been sleeping well. Denies any complaints. Verbalized no tooth pain. Pt denies any withdrawal symptoms. Pt states she is proud she has no craving for alcohol or smoking vape while she was here.     10/06/21 2130  Psych Admission Type (Psych Patients Only)  Admission Status Voluntary  Psychosocial Assessment  Patient Complaints None  Eye Contact Fair  Facial Expression Other (Comment) (unremarkable)  Affect Appropriate to circumstance  Speech Logical/coherent  Interaction Assertive  Motor Activity Other (Comment) (WNL)  Appearance/Hygiene Unremarkable  Behavior Characteristics Cooperative;Appropriate to situation  Mood Pleasant  Thought Process  Coherency WDL  Content WDL  Delusions None reported or observed  Perception WDL  Hallucination None reported or observed  Judgment WDL  Confusion None  Danger to Self  Current suicidal ideation? Denies  Danger to Others  Danger to Others None reported or observed

## 2021-10-07 NOTE — [facility] Group Notes (Signed)
Adult Psychoeducational Group Note  Date:  10/07/2021 Time:  11:18 AM  Group Topic/Focus:  Goals Group:   The focus of this group is to help patients establish daily goals to achieve during treatment and discuss how the patient can incorporate goal setting into their daily lives to aide in recovery.  Participation Level:  Active  Participation Quality:  Appropriate  Affect:  Appropriate  Cognitive:  Appropriate  Insight: Appropriate  Engagement in Group:  Engaged  Modes of Intervention:  Discussion  Additional Comments: Patient attended goals group and participated.   Kayson Tasker W Shenell Rogalski 10/07/2021, 11:18 AM

## 2021-10-07 NOTE — Progress Notes (Signed)
   10/07/21 2255  Psych Admission Type (Psych Patients Only)  Admission Status Voluntary  Psychosocial Assessment  Patient Complaints None  Eye Contact Fair  Facial Expression Animated  Affect Appropriate to circumstance  Speech Logical/coherent  Interaction Assertive  Motor Activity Other (Comment) (WDL)  Appearance/Hygiene Unremarkable  Behavior Characteristics Appropriate to situation  Mood Pleasant  Thought Process  Coherency WDL  Content WDL  Delusions None reported or observed  Perception WDL  Hallucination None reported or observed  Judgment WDL  Confusion None  Danger to Self  Current suicidal ideation? Denies  Self-Injurious Behavior No self-injurious ideation or behavior indicators observed or expressed   Agreement Not to Harm Self Yes  Description of Agreement Verbal agreement to not harm self  Danger to Others  Danger to Others None reported or observed

## 2021-10-08 ENCOUNTER — Encounter (HOSPITAL_COMMUNITY): Payer: Self-pay | Admitting: Registered Nurse

## 2021-10-08 DIAGNOSIS — F332 Major depressive disorder, recurrent severe without psychotic features: Principal | ICD-10-CM

## 2021-10-08 MED ORDER — LORAZEPAM 1 MG PO TABS
1.0000 mg | ORAL_TABLET | Freq: Once | ORAL | Status: AC
  Administered 2021-10-08: 1 mg via ORAL
  Filled 2021-10-08: qty 1

## 2021-10-08 NOTE — Group Note (Unsigned)
Date:  10/08/2021 Time:  11:07 AM  Group Topic/Focus:  Goals Group:   The focus of this group is to help patients establish daily goals to achieve during treatment and discuss how the patient can incorporate goal setting into their daily lives to aide in recovery.     Participation Level:  {B[facility] PARTICIPATION BTDVV:61607}  Participation Quality:  {B[facility] PARTICIPATION QUALITY:22265}  Affect:  {B[facility] AFFECT:22266}  Cognitive:  {B[facility] COGNITIVE:22267}  Insight: {BHH Insight2:20797}  Engagement in Group:  {B[facility] ENGAGEMENT IN PXTGG:26948}  Modes of Intervention:  {B[facility] MODES OF INTERVENTION:22269}  Additional Comments:  ***  Jaquita Rector 10/08/2021, 11:07 AM

## 2021-10-08 NOTE — Progress Notes (Signed)
   10/08/21 2229  Psych Admission Type (Psych Patients Only)  Admission Status Voluntary  Psychosocial Assessment  Patient Complaints Anxiety  Eye Contact Fair  Facial Expression Animated  Affect Appropriate to circumstance  Speech Logical/coherent  Interaction Assertive  Motor Activity Other (Comment) (WDL)  Appearance/Hygiene Unremarkable  Behavior Characteristics Appropriate to situation  Mood Pleasant  Thought Process  Coherency WDL  Content WDL  Delusions None reported or observed  Perception WDL  Hallucination None reported or observed  Judgment WDL  Confusion None  Danger to Self  Current suicidal ideation? Denies  Self-Injurious Behavior No self-injurious ideation or behavior indicators observed or expressed   Agreement Not to Harm Self Yes  Description of Agreement verbal  Danger to Others  Danger to Others None reported or observed

## 2021-10-08 NOTE — Group Note (Signed)
[facility] LCSW Group Therapy Note  Date/Time:  10/08/2021 10:00-11:00AM  Type of Therapy and Topic:  Group Therapy:  Healthy and Unhealthy Supports plus being "My Own Hero"  Participation Level:  Active   Description of Group: Patients in this group were invited to identify the differences between healthy and unhealthy supports and then to identify those people in their lives who fall into one category or the other, as well as why.  They were then introduced to the idea of adding more healthy supports and decreasing the unhealthy ones.  Patients discussed what additional healthy supports could be helpful in their recovery and wellness after discharge in order to maintain stability.   An emphasis was placed on using counselor, doctor, therapy groups, 12-step groups, and problem-specific support groups to expand supports.  The song "My Own Hero" was played to encourage full participation in their own recovery journey instead of expecting others to do all the work for them.  The song "I Know Where I've Been" was played as further encouragement that they are further in their journey than they were previously.  Therapeutic Goals:   1)  discuss importance of adding supports to stay well once out of the hospital  2)  compare healthy versus unhealthy supports and identify some examples of each  3)  generate ideas and descriptions of healthy supports that can be added  4)  offer mutual support about how to address unhealthy supports  5)  encourage active participation in and adherence to discharge plan    Summary of Patient Progress:  The patient stated that current healthy supports in their life are their family and dog, while current unhealthy supports include "her being in her own head". The patient expressed a willingness to add appropriate support(s) to help in their recovery journey.   Therapeutic Modalities:   Motivational Interviewing Brief Solution-Focused Therapy  Kamare Caspers [place],  [place] 10/08/2021 1:14 PM

## 2021-10-08 NOTE — [facility] Group Notes (Signed)
Adult Psychoeducational Group Note  Date:  10/08/2021 Time:  8:50 PM  Group Topic/Focus:  Identifying Needs:   The focus of this group is to help patients identify their personal needs that have been historically problematic and identify healthy behaviors to address their needs.  Participation Level:  Active  Participation Quality:  Appropriate  Affect:  Appropriate  Cognitive:  Alert  Insight: Good  Engagement in Group:  Engaged  Modes of Intervention:  Discussion  Additional Comments  Jacalyn Lefevre 10/08/2021, 8:50 PM

## 2021-10-08 NOTE — Group Note (Signed)
Date:  10/08/2021 Time:  11:13 AM  Group Topic/Focus:  Goals Group:   The focus of this group is to help patients establish daily goals to achieve during treatment and discuss how the patient can incorporate goal setting into their daily lives to aide in recovery.    Participation Level:  Active  Participation Quality:  Appropriate  Affect:  Appropriate  Cognitive:  Appropriate  Insight: Appropriate  Engagement in Group:  Engaged  Modes of Intervention:  Discussion  Additional Comments:  Pt is focused on discharge planning and talking to Dr and SW.  Jaquita Rector 10/08/2021, 11:13 AM

## 2021-10-08 NOTE — Progress Notes (Addendum)
[facility] MD Progress Note  10/08/2021 12:37 PM Ashley Bautista  MRN:  [id number]  Subjective:  Ashley Bautista is a 35 year old female with a documented psychiatric history of MDD and EtoH use disorder and a reported psychiatric history of bipolar disorder who presented to [facility] and was transferred to [facility] voluntarily after endorsing SI w/ plan.  Ashley Bautista, 35 y.o., female patient seen face to face by this provider,  discussed with multidisciplinary team, consulted with Dr. Viann Fish; and chart reviewed on 10/08/21.  On evaluation Ashley Bautista reports she is doing well.  Patient states she has not spoken with social work about anyone to tell her if she has been set up with outpatient psychiatric services.  Patient states she works for 7 AM to 3 PM daily.  Reports she is interested in virtual services if able to receive that way she may be able to do her therapy sessions during her lunch break.  Patient reports she is eating without any difficulty.  Reports she has been sleeping well, but woke last night related to aclasia during, but went back to sleep.  States she is tolerating medications without adverse reaction; and attending/participating in group sessions.  Patient denies suicidal/self-harm/homicidal ideation, psychosis, and paranoia.     During evaluation Ashley Bautista is sitting upright in chair, no distress noted.  She is alert/oriented x 4; calm/cooperative; and mood congruent with affect.  She is speaking in a clear tone at moderate volume, and normal pace; with good eye contact.  Her thought process is coherent and relevant; There is no indication that she is currently responding to internal/external stimuli or experiencing delusional thought content; and she has denied suicidal/self-harm/homicidal ideation, psychosis, and paranoia.   Patient has remained calm throughout assessment and has answered questions appropriately.    Total Time Spent with patient: 15 minutes  Principal Problem:  MDD (major depressive disorder), recurrent severe, without psychosis ([place]) Diagnosis: Principal Problem:   MDD (major depressive disorder), recurrent severe, without psychosis ([place]) Active Problems:   Alcohol use disorder, severe, dependence ([place])   Cannabis abuse  Total Time spent with patient: I personally spent 20 minutes on the unit in direct patient care. The direct patient care time included face-to-face time with the patient, reviewing the patient's chart, communicating with other professionals, and coordinating care. Greater than 50% of this time was spent in counseling or coordinating care with the patient regarding goals of hospitalization, psycho-education, and discharge planning needs.   Past Psychiatric History: See H&P  Past Medical History: see H&P  Family History: see H&P  Family Psychiatric  History: See H&P  Social History:  Social History   Substance and Sexual Activity  Alcohol Use Yes   Comment: 1/5th liquor daily     Social History   Substance and Sexual Activity  Drug Use Yes   Types: Marijuana    Social History   Socioeconomic History   Marital status: Single    Spouse name: Not on file   Number of children: Not on file   Years of education: Not on file   Highest education level: Not on file  Occupational History   Not on file  Tobacco Use   Smoking status: Former   Smokeless tobacco: Never  Vaping Use   Vaping Use: Every day   Substances: Nicotine, Flavoring  Substance and Sexual Activity   Alcohol use: Yes    Comment: 1/5th liquor daily   Drug use: Yes    Types: Marijuana   Sexual activity: Not  on file  Other Topics Concern   Not on file  Social History Narrative   Not on file   Social Determinants of Health   Financial Resource Strain: Not on file  Food Insecurity: Not on file  Transportation Needs: Not on file  Physical Activity: Not on file  Stress: Not on file  Social Connections: Not on file    Sleep: Good  Appetite:   Good  Current Medications: Current Facility-Administered Medications  Medication Dose Route Frequency Provider Last Rate Last Admin   alum & mag hydroxide-simeth (MAALOX/MYLANTA) 200-200-20 MG/5ML suspension 30 mL  30 mL Oral Q4H PRN Rankin, Shuvon B, NP       benzocaine (ORAJEL) 10 % mucosal gel   Mouth/Throat TID PRN Prescilla Sours, PA-C   1 application at 0000000 2338   gabapentin (NEURONTIN) capsule 200 mg  200 mg Oral TID Rosezetta Schlatter, MD   200 mg at 10/08/21 0758   hydrOXYzine (ATARAX) tablet 25 mg  25 mg Oral TID PRN Ajibola, Ene A, NP       ibuprofen (ADVIL) tablet 600 mg  600 mg Oral Q6H PRN Lindon Romp A, NP   600 mg at 10/07/21 2132   multivitamin with minerals tablet 1 tablet  1 tablet Oral Daily Revonda Humphrey, NP   1 tablet at 10/08/21 0758   sertraline (ZOLOFT) tablet 25 mg  25 mg Oral Daily Damita Dunnings B, MD   25 mg at 10/08/21 0758   thiamine tablet 100 mg  100 mg Oral Daily Revonda Humphrey, NP   100 mg at 10/08/21 [id number]    Lab Results:  No results found for this or any previous visit (from the past 52 hour(s)).   Blood Alcohol level:  Lab Results  Component Value Date   ETH 217 (H) 123XX123    Metabolic Disorder Labs: Lab Results  Component Value Date   HGBA1C 4.7 (L) 10/03/2021   MPG 88.19 10/03/2021   No results found for: PROLACTIN Lab Results  Component Value Date   CHOL 146 10/03/2021   TRIG 53 10/03/2021   HDL 92 10/03/2021   CHOLHDL 1.6 10/03/2021   VLDL 11 10/03/2021   LDLCALC 43 10/03/2021    Physical Findings: AIMS: Facial and Oral Movements Muscles of Facial Expression: None, normal Lips and Perioral Area: None, normal Jaw: None, normal Tongue: None, normal,Extremity Movements Upper (arms, wrists, hands, fingers): None, normal Lower (legs, knees, ankles, toes): None, normal, Trunk Movements Neck, shoulders, hips: None, normal, Overall Severity Severity of abnormal movements (highest score from questions above): None,  normal Incapacitation due to abnormal movements: None, normal Patient's awareness of abnormal movements (rate only patient's report): No Awareness, Dental Status Current problems with teeth and/or dentures?: No Does patient usually wear dentures?: No  CIWA:  CIWA-Ar Total: 0    Musculoskeletal: Strength & Muscle Tone: within normal limits Gait & Station: normal, steady Patient leans: N/A  Psychiatric Specialty Exam:  Presentation  General Appearance: Appropriate for Environment  Eye Contact:Good  Speech:Clear and Coherent; Normal Rate  Speech Volume:Normal  Handedness:Right  Mood and Affect  Mood:more euthymic appearing  Affect:Full range; pleasant - can smile today   Thought Process  Thought Processes:Coherent; Goal Directed goal directed (future-oriented)  Descriptions of Associations:Intact  Orientation:Full (Time, Place and Person)  Thought Content:WDL; Logical  - denies AVH, paranoia, or delusions  History of Schizophrenia/Schizoaffective disorder:No  Duration of Psychotic Symptoms:NA Hallucinations:Hallucinations: None  Ideas of Reference:None  Suicidal Thoughts:Suicidal Thoughts: No  Homicidal Thoughts:Homicidal  Thoughts: No   Sensorium  Memory:Immediate Good; Recent Good; Remote Good  Judgment: Good  Insight: Good   Executive Functions  Concentration:Good  Attention Span:Good  Recall:Good  Fund of Knowledge:Good  Language:Good   Psychomotor Activity  Psychomotor Activity:Psychomotor Activity: Normal   Assets  Assets:Communication Skills; Desire for Improvement; Financial Resources/Insurance; Housing; Resilience; Social Support   Sleep  Sleep:hours not documented   Physical Exam Vitals and nursing note reviewed.  Constitutional:      General: She is not in acute distress.    Appearance: Normal appearance. She is not toxic-appearing or diaphoretic.  HENT:     Head: Normocephalic and atraumatic.  Cardiovascular:      Rate and Rhythm: Normal rate.  Pulmonary:     Effort: Pulmonary effort is normal.  Musculoskeletal:        General: Normal range of motion.     Cervical back: Normal range of motion.  Skin:    General: Skin is warm and dry.  Neurological:     General: No focal deficit present.     Mental Status: She is alert.     Motor: No weakness.     Gait: Gait normal.  Psychiatric:        Attention and Perception: Attention and perception normal. She does not perceive auditory or visual hallucinations.        Mood and Affect: Mood and affect normal.        Speech: Speech normal.        Behavior: Behavior normal. Behavior is cooperative.        Thought Content: Thought content normal. Thought content is not paranoid or delusional. Thought content does not include homicidal or suicidal ideation.        Cognition and Memory: Cognition and memory normal.   Review of Systems  Constitutional:  Negative for chills, diaphoresis and malaise/fatigue.  HENT:  Negative for congestion.   Respiratory:  Negative for shortness of breath.   Cardiovascular:  Negative for chest pain.  Gastrointestinal: Negative.   Genitourinary: Negative.   Musculoskeletal: Negative.   Neurological:  Negative for dizziness, tingling, tremors, weakness and headaches.  Psychiatric/Behavioral:  Depression: Reports improoved.  "Good, medicines really helped". Hallucinations: Denies. Suicidal ideas: Denies. Nervous/anxious: Reporting having "Peaks of anxiety but medicine helps.  When feel anxious I just take my medicine.".   Blood pressure 116/81, pulse 85, temperature 98.3 F (36.8 C), temperature source Oral, resp. rate 18, height 5\' 6"  (1.676 m), weight 65.8 kg, SpO2 100 %. Body mass index is 23.4 kg/m.   Treatment Plan Summary: Daily contact with patient to assess and evaluate symptoms and progress in treatment and Medication management Ashley Bautista is a 35 yo patient who appears to present with symptoms congruent with MDD,  recurrent, severe. Patient also appears to suffer from EtOH use disorder. Patient EtoH was elevated but patient endorses  no hx of withdrawal; however will continue to monitor patient and encourage patient to consider rehab or assistance with sobriety. Regarding patient's mood it appears unlikely that patient truly has Bipolar disorder and this prior dx was likely clouded by substance use.    Reviewed Labs: EtOH 217, HgbA1c- 4.7, HIV ab (-), Lipid panel WNL, UA- does not indicate UTI, U Preg (-), UDS (+ THC), GC/Chlamydia- pending EKG- QTC 416 (12/6)   MDD, recurrent, severe w/o psychosis (r/o Substance induced mood disorder) -Continue Zoloft 25mg  for depressive symptoms   Alcohol use d/o - Ativan Detox Protocol with scheduled Ativan taper and Ativan 1mg   for CIWA score >10; oral MVI and thiamine replacement (recent CIWA scores 0,0,0) (ends 12/11) -Continue gabapentin 200mg  TID to help with cravings, potential withdrawal and anxiety  Elevated AST - resolved - Acute hepatitis panel nonreactive - Repeat LFTS show AST down to 36 but has elevated total bili 1.8, direct bili 0.3, and indirect bili 1.5 - will need monitoring as an outpatient and patient made aware   Safety: Recommend routine q 61min checks.    Discharge Planning:              -- Social work and case management to assist with discharge planning and identification of hospital follow-up needs prior to discharge.              -- Estimated DoD: 12/12             -- Discharge Concerns: Need to establish a safety plan; Medication compliance and effectiveness; Alcohol use treatment resources             -- Discharge Goals: Return home with outpatient referrals for mental health follow-up including medication management/psychotherapy    Continue current treatment plan.  No changes at this time.  Possible discharge home tomorrow.  Shuvon Rankin, NP 10/08/2021, 12:37 PM  I have reviewed the patient's chart and discussed the case with the  APP. I agree with the assessment and plan of care as documented in the APP's note.   Viann Fish, MD, [facility]

## 2021-10-08 NOTE — BHH Group Notes (Signed)
Pt attended and participated in Child psychotherapist group.

## 2021-10-08 NOTE — Group Note (Signed)
Date:  10/08/2021 Time:  2:49 PM  Group Topic/Focus:  Building Self Esteem:   The Focus of this group is helping patients become aware of the effects of self-esteem on their lives, the things they and others do that enhance or undermine their self-esteem, seeing the relationship between their level of self-esteem and the choices they make and learning ways to enhance self-esteem.    Participation Level:  Active  Participation Quality:  Appropriate  Affect:  Appropriate  Cognitive:  Appropriate  Insight: Appropriate  Engagement in Group:  Engaged  Modes of Intervention:  Education  Additional Comments:  Patient did not participate in group today. Although, he did stay and listen.  Reymundo Poll 10/08/2021, 2:49 PM

## 2021-10-08 NOTE — Progress Notes (Addendum)
D: Patient is pleasant upon approach. She is compliant with her medications and knows the indications of each. She explained to staff that she would like to see a Child psychotherapist today. She has been attending groups and appears vested in her treatment. Patient remains visible in the milieu and interacts with her peers well. She is calm and cooperative. She denies any depression or hopelessness today; her anxiety is a 1. Her goal today is to "get everything ready for my discharge and meet with doctors and my social worker." Patient denies any thoughts of self harm and does not appear to be responding to internal stimuli.  A: Continue to monitor medication management and MD orders.  Safety checks completed every 15 minutes per protocol.  Offer support and encouragement as needed.  R: Patient is receptive to staff; her behavior is appropriate.     10/08/21 1600  Psych Admission Type (Psych Patients Only)  Admission Status Voluntary  Psychosocial Assessment  Patient Complaints Anxiety  Eye Contact Fair  Facial Expression Animated  Affect Appropriate to circumstance  Speech Logical/coherent  Interaction Assertive  Motor Activity Other (Comment) (wnl)  Appearance/Hygiene Unremarkable  Behavior Characteristics Appropriate to situation  Mood Pleasant  Thought Process  Coherency WDL  Content WDL  Delusions None reported or observed  Perception WDL  Hallucination None reported or observed  Judgment WDL  Confusion None  Danger to Self  Current suicidal ideation? Denies  Self-Injurious Behavior No self-injurious ideation or behavior indicators observed or expressed   Agreement Not to Harm Self Yes  Description of Agreement verbal  Danger to Others  Danger to Others None reported or observed

## 2021-10-08 NOTE — BHH Suicide Risk Assessment (Signed)
[facility] Discharge Suicide Risk Assessment   Principal Problem: MDD (major depressive disorder), recurrent severe, without psychosis ([place]) Discharge Diagnoses: Principal Problem:   MDD (major depressive disorder), recurrent severe, without psychosis ([place]) Active Problems:   Alcohol use disorder, severe, dependence ([place])   Cannabis abuse  Total Time Spent in Direct Patient Care:  I personally spent 35 minutes on the unit in direct patient care. The direct patient care time included face-to-face time with the patient, reviewing the patient's chart, communicating with other professionals, and coordinating care. Greater than 50% of this time was spent in counseling or coordinating care with the patient regarding goals of hospitalization, psycho-education, and discharge planning needs.  Subjective: Patient is seen on rounds with Doctor, hospital.  She denies SI, HI, AVH, delusions, or paranoia.  She states her mood is stable and improved and she reports good sleep and appetite.  She voices no physical complaints and denies current signs of alcohol withdrawal or cravings.  She was encouraged to abstain from alcohol and illicit substance use after discharge and to start AA and outpatient substance abuse treatment after discharge.  She was advised that she may need dose titration up on her Zoloft after discharge but has resisted further dose increase during admission.  She was made aware that her total, direct, and indirect bilirubin are elevated and need to be followed by a primary care physician with additional work-up as an outpatient.  She can articulate a safety and discharge plan and is forward thinking today.  Musculoskeletal: Strength & Muscle Tone: within normal limits Gait & Station: normal Patient leans: N/A  Psychiatric Specialty Exam: Physical Exam Vitals and nursing note reviewed.  Constitutional:      Appearance: Normal appearance.  HENT:     Head: Normocephalic.  Pulmonary:     Effort:  Pulmonary effort is normal.  Neurological:     General: No focal deficit present.     Mental Status: She is alert.    Review of Systems  Respiratory:  Negative for shortness of breath.   Cardiovascular:  Negative for chest pain.  Gastrointestinal:  Negative for nausea and vomiting.   Blood pressure 105/86, pulse 83, temperature 97.7 F (36.5 C), temperature source Oral, resp. rate 18, height 5\' 6"  (1.676 m), weight 65.8 kg, SpO2 100 %.Body mass index is 23.4 kg/m.  General Appearance:  casually dressed, adequate hygiene  Eye Contact:  Good  Speech:  Clear and Coherent and Normal Rate  Volume:  Normal  Mood:  Euthymic  Affect:  Congruent  Thought Process:  Goal Directed and Linear  Orientation:  Full (Time, Place, and Person)  Thought Content:  Logical and denies AVH, delusions or paranoia  Suicidal Thoughts:  No  Homicidal Thoughts:  No  Memory:  Recent;   Good  Judgement:  Fair  Insight:  Fair  Psychomotor Activity:  Normal  Concentration:  Concentration: Good and Attention Span: Good  Recall:  Good  Fund of Knowledge:  Good  Language:  Good  Akathisia:  Negative  Assets:  Communication Skills Desire for Improvement Housing Resilience Social Support Vocational/Educational  ADL's:  Intact  Cognition:  WNL    Mental Status Per Nursing Assessment::   On Admission:  Suicidal ideation indicated by patient - resolved  Demographic Factors:  Gay, lesbian, or bisexual orientation and Living alone  Loss Factors: Loss of significant relationship  Historical Factors: Prior suicide attempts and alcohol use prior to admission  Risk Reduction Factors:   Sense of responsibility to family,  Employed, Positive social support, and Positive coping skills or problem solving skills  Continued Clinical Symptoms:  Depression:   Impulsivity Alcohol/Substance Abuse/Dependencies  Cognitive Features That Contribute To Risk:  None    Suicide Risk:  Mild:  There are no identifiable  plans, no associated intent, mild dysphoria and related symptoms, good self-control on the unit, few other risk factors, and identifiable protective factors, including available and accessible social support.   Follow-up Information     BEHAVIORAL HEALTH OUTPATIENT THERAPY [place] Follow up.   Specialty: Behavioral Health Why: You have an appointment for therapy services on 10/19/21 at 1:20 pm.  This appointment will be held in person. Contact information: [address] [id number] mc [place] [address] [phone]        [facility] Go on 11/03/2021.   Why: You have an appointment for medication management services on 11/03/20 at 11:00 am.   This appointment will be held in person. Contact information: [address] [place] [address] [phone]                 Plan Of Care/Follow-up recommendations:  Activity:  as tolerated Diet:  heart healthy Other:  Patient advised to follow through on scheduled outpatient behavioral health and substance abuse appointments and to comply with medications.  She was encouraged to abstain from alcohol and illicit substance use after discharge.  She was advised to see a primary care provider without fail for recheck of her elevated bilirubin.She was encouraged to start AA.  Comer Locket, MD, FAPA 10/09/2021, 8:13 AM

## 2021-10-09 ENCOUNTER — Encounter (HOSPITAL_COMMUNITY): Payer: Self-pay

## 2021-10-09 MED ORDER — THIAMINE HCL 100 MG PO TABS: 100.0000 mg | ORAL_TABLET | Freq: Every day | ORAL | 0 refills | Status: DC

## 2021-10-09 MED ORDER — GABAPENTIN 600 MG PO TABS: 300.0000 mg | ORAL_TABLET | Freq: Two times a day (BID) | ORAL | 0 refills | Status: DC

## 2021-10-09 MED ORDER — SERTRALINE HCL 25 MG PO TABS: 25.0000 mg | ORAL_TABLET | Freq: Every day | ORAL | 0 refills | Status: DC

## 2021-10-09 NOTE — Progress Notes (Signed)
RN met with pt and reviewed pt's discharge instructions. Pt verbalized understanding of discharge instructions and pt did not have any questions. RN reviewed and provided pt with a copy of SRA, AVS and Transition Record. RN returned pt's belongings to pt. Pt was smiling and starting conversation. Pt has improved and stated she is ready to leave. Pt denied SI/HI/AVH and voiced no concerns. Pt was appreciative of the care pt received at [facility]. Patient discharged to the lobby without incident.   10/09/21 0819  Psych Admission Type (Psych Patients Only)  Admission Status Voluntary  Psychosocial Assessment  Patient Complaints None  Eye Contact Fair  Facial Expression Other (Comment) (wdl)  Affect Appropriate to circumstance  Speech Logical/coherent  Interaction Assertive  Motor Activity Other (Comment) (wdl)  Appearance/Hygiene Unremarkable  Behavior Characteristics Appropriate to situation  Mood Pleasant;Euthymic  Thought Process  Coherency WDL  Content WDL  Delusions None reported or observed  Perception WDL  Hallucination None reported or observed  Judgment WDL  Confusion None  Danger to Self  Current suicidal ideation? Denies  Danger to Others  Danger to Others None reported or observed

## 2021-10-09 NOTE — Discharge Instructions (Addendum)
Dear Ashley Bautista, I am so glad you are feeling better and can be discharged today (10/09/2021)! You were admitted for major depressive disorder and detox.   Please see the following instructions: Please attend the scheduled appointment or make an appointment to follow-up with your psychiatrist, therapist, and the other specialists seen below. Please see your primary care / family doctor for your other medical issues, concerns and or health care needs. NEW meds:  Gabapentin 600 mg tablet (Take 300 mg/ 0.5 tablet by mouth twice daily for anxiety and alcohol cravings) and Sertraline 25 mg (Take one tablet by mouth daily for depression. Discuss with your outpatient provider the need to increase this dose). Follow-up labs: Liver function panel due to elevated bilirubin.  Report any adverse effects and or reactions from the medicines to your outpatient provider promptly. Do not engage in alcohol and or illegal drug use while on prescription medicines. In the event of worsening symptoms, text or call the National Suicide Prevention Crisis Hotline at 68, call 911, visit the [facility] located at [address], and/or go to the nearest ED for appropriate evaluation and treatment of symptoms.  It was a pleasure meeting you, Ashley Bautista.  I wish you and your family the best, and hope you stay happy and healthy!  Lamar Sprinkles, MD 10/09/2021

## 2021-10-09 NOTE — Progress Notes (Signed)
  [facility] Adult Case Management Discharge Plan :  Will you be returning to the same living situation after discharge:  Yes,  Home  At discharge, do you have transportation home?: Yes,  Mother  Do you have the ability to pay for your medications: Yes,  Insurance   Release of information consent forms completed and in the chart;  Patient's signature needed at discharge.  Patient to Follow up at:  Follow-up Information     BEHAVIORAL HEALTH OUTPATIENT THERAPY [place] Follow up on 10/19/2021.   Specialty: Behavioral Health Why: You have an appointment for therapy services on 10/19/21 at 1:20 pm.  This appointment will be held in person. Contact information: [address] [id number] mc [place] [address] [phone]        [facility] Go on 11/03/2021.   Why: You have an appointment for medication management services on 11/03/20 at 11:00 am.   This appointment will be held in person. Contact information: [address] [place] [address] [phone]                 Next level of care provider has access to [facility] Link:yes  Safety Planning and Suicide Prevention discussed: Yes,  with patient and mother      Has patient been referred to the Quitline?: Patient refused referral  Patient has been referred for addiction treatment: Pt. refused referral  Aram Beecham, LCSWA 10/09/2021, 10:45 AM

## 2021-10-09 NOTE — BH IP Treatment Plan (Unsigned)
Interdisciplinary Treatment and Diagnostic Plan Update  10/09/2021 Time of Session: Did not attend- plan update Ashley Bautista MRN: [id number]  Principal Diagnosis: MDD (major depressive disorder), recurrent severe, without psychosis ([place])  Secondary Diagnoses: Principal Problem:   MDD (major depressive disorder), recurrent severe, without psychosis ([place]) Active Problems:   Alcohol use disorder, severe, dependence ([place])   Cannabis abuse   Current Medications:  Current Facility-Administered Medications  Medication Dose Route Frequency Provider Last Rate Last Admin   alum & mag hydroxide-simeth (MAALOX/MYLANTA) 200-200-20 MG/5ML suspension 30 mL  30 mL Oral Q4H PRN Rankin, Shuvon B, NP       benzocaine (ORAJEL) 10 % mucosal gel   Mouth/Throat TID PRN Prescilla Sours, PA-C   1 application at 70/01/74 2338   gabapentin (NEURONTIN) capsule 200 mg  200 mg Oral TID Rosezetta Schlatter, MD   200 mg at 10/09/21 9449   hydrOXYzine (ATARAX) tablet 25 mg  25 mg Oral TID PRN Ajibola, Ene A, NP   25 mg at 10/08/21 2106   ibuprofen (ADVIL) tablet 600 mg  600 mg Oral Q6H PRN Rozetta Nunnery, NP   600 mg at 10/08/21 2106   multivitamin with minerals tablet 1 tablet  1 tablet Oral Daily Revonda Humphrey, NP   1 tablet at 10/09/21 6759   sertraline (ZOLOFT) tablet 25 mg  25 mg Oral Daily Damita Dunnings B, MD   25 mg at 10/09/21 1638   thiamine tablet 100 mg  100 mg Oral Daily Revonda Humphrey, NP   100 mg at 10/09/21 4665   PTA Medications: Medications Prior to Admission  Medication Sig Dispense Refill Last Dose   ibuprofen (ADVIL) 200 MG tablet Take 200 mg by mouth every 6 (six) hours as needed for moderate pain, mild pain or headache.      oxyCODONE-acetaminophen (PERCOCET/ROXICET) 5-325 MG tablet Take 1 tablet by mouth every 8 (eight) hours as needed for severe pain. 8 tablet 0    sulfamethoxazole-trimethoprim (BACTRIM DS) 800-160 MG tablet Take 1 tablet by mouth 2 (two) times daily.       Patient  Stressors: Marital or family conflict   Substance abuse    Patient Strengths: Capable of independent living  Marketing executive fund of knowledge  Motivation for treatment/growth  Physical Health  Supportive family/friends   Treatment Modalities: Medication Management, Group therapy, Case management,  1 to 1 session with clinician, Psychoeducation, Recreational therapy.   Physician Treatment Plan for Primary Diagnosis: MDD (major depressive disorder), recurrent severe, without psychosis ([place]) Long Term Goal(s): Improvement in symptoms so as ready for discharge   Short Term Goals: Ability to identify changes in lifestyle to reduce recurrence of condition will improve Ability to verbalize feelings will improve Ability to disclose and discuss suicidal ideas Ability to demonstrate self-control will improve Ability to identify and develop effective coping behaviors will improve Ability to maintain clinical measurements within normal limits will improve Ability to identify triggers associated with substance abuse/mental health issues will improve Compliance with prescribed medications will improve  Medication Management: Evaluate patient's response, side effects, and tolerance of medication regimen.  Therapeutic Interventions: 1 to 1 sessions, Unit Group sessions and Medication administration.  Evaluation of Outcomes: Met  Physician Treatment Plan for Secondary Diagnosis: Principal Problem:   MDD (major depressive disorder), recurrent severe, without psychosis ([place]) Active Problems:   Alcohol use disorder, severe, dependence ([place])   Cannabis abuse  Long Term Goal(s): Improvement in symptoms so as ready for discharge  Short Term Goals: Ability to identify changes in lifestyle to reduce recurrence of condition will improve Ability to verbalize feelings will improve Ability to disclose and discuss suicidal ideas Ability to demonstrate self-control will improve Ability  to identify and develop effective coping behaviors will improve Ability to maintain clinical measurements within normal limits will improve Ability to identify triggers associated with substance abuse/mental health issues will improve Compliance with prescribed medications will improve     Medication Management: Evaluate patient's response, side effects, and tolerance of medication regimen.  Therapeutic Interventions: 1 to 1 sessions, Unit Group sessions and Medication administration.  Evaluation of Outcomes: Met   RN Treatment Plan for Primary Diagnosis: MDD (major depressive disorder), recurrent severe, without psychosis ([place]) Long Term Goal(s): Knowledge of disease and therapeutic regimen to maintain health will improve  Short Term Goals: Ability to remain free from injury will improve, Ability to verbalize frustration and anger appropriately will improve, Ability to demonstrate self-control, Ability to participate in decision making will improve, Ability to verbalize feelings will improve, Ability to disclose and discuss suicidal ideas, Ability to identify and develop effective coping behaviors will improve, and Compliance with prescribed medications will improve  Medication Management: RN will administer medications as ordered by provider, will assess and evaluate patient's response and provide education to patient for prescribed medication. RN will report any adverse and/or side effects to prescribing provider.  Therapeutic Interventions: 1 on 1 counseling sessions, Psychoeducation, Medication administration, Evaluate responses to treatment, Monitor vital signs and CBGs as ordered, Perform/monitor CIWA, COWS, AIMS and Fall Risk screenings as ordered, Perform wound care treatments as ordered.  Evaluation of Outcomes: Adequate for Discharge   LCSW Treatment Plan for Primary Diagnosis: MDD (major depressive disorder), recurrent severe, without psychosis ([place]) Long Term Goal(s): Safe  transition to appropriate next level of care at discharge, Engage patient in therapeutic group addressing interpersonal concerns.  Short Term Goals: Engage patient in aftercare planning with referrals and resources, Increase social support, Increase ability to appropriately verbalize feelings, Increase emotional regulation, Facilitate acceptance of mental health diagnosis and concerns, Facilitate patient progression through stages of change regarding substance use diagnoses and concerns, Identify triggers associated with mental health/substance abuse issues, and Increase skills for wellness and recovery  Therapeutic Interventions: Assess for all discharge needs, 1 to 1 time with Social worker, Explore available resources and support systems, Assess for adequacy in community support network, Educate family and significant other(s) on suicide prevention, Complete Psychosocial Assessment, Interpersonal group therapy.  Evaluation of Outcomes: Adequate for Discharge   Progress in Treatment: Attending groups: Yes. Participating in groups: Yes. Taking medication as prescribed: Yes. Toleration medication: Yes. Family/Significant other contact made: Yes, individual(s) contacted:  Lynnmarie Lovett [phone] (Mother Patient understands diagnosis: Yes. Discussing patient identified problems/goals with staff: Yes. Medical problems stabilized or resolved: Yes. Denies suicidal/homicidal ideation: Yes. Issues/concerns per patient self-inventory: No   New problem(s) identified: No, Describe:  none reported  New Short Term/Long Term Goal(s):  medication stabilization, elimination of SI thoughts, development of comprehensive mental wellness plan.    Patient Goals:  "To get on the right medications"   Discharge Plan or Barriers:  Patient will be discharging today, no barriers noted.   Reason for Continuation of Hospitalization: Other; describe patient is discharging  Estimated Length of Stay:  Discharging today   Scribe for Treatment Team: Zachery Conch, LCSW 10/09/2021 11:21 AM

## 2021-10-09 NOTE — Discharge Summary (Signed)
Physician Discharge Summary Note  Patient:  Ashley Bautista is an 35 y.o., female MRN:  [id number] DOB:  Nov 04, 1985 Patient phone:  [phone] (home)  Patient address:   [address] [place] [address],  Total Time spent with patient: I personally spent 35 minutes on the unit in direct patient care. The direct patient care time included face-to-face time with the patient, reviewing the patient's chart, communicating with other professionals, and coordinating care. Greater than 50% of this time was spent in counseling or coordinating care with the patient regarding goals of hospitalization, psycho-education, and discharge planning needs.   Date of Admission:  10/04/2021 Date of Discharge: 10/09/2021  Reason for Admission:  Per H&P- "Deauna Yaw is a 35yo female who has PPH of Bipolar disorder?, MDD, and EtoH use disorder who presented to [facility] and was transferred to [facility] voluntarily after endorsing SI w/ plan.   On assessment today patient reports that she has been having worsening SI over the past few months, and more stressors over the past few weeks. Patient reports that she became very concerned yesterday by her SI, endorsing that she felt it was becoming stronger. Patient reports in response she reached out to her mother who brought her to [facility]. Patient reports that she was having thoughts of OD on pills or to walk out in traffic. Patient reports that she is normally able to "Control" these thoughts but was not yesterday.Patient denies SI, HI, and AVH on assessment today. Patient reports that over the last few weeks her sleep has decreased, she endorses anhedonia, guilt, decreased energy, decreased concentration, decreased appetite. Patient reports that her guilt is associated with moving to [place] recently and bringing her GF with her. Patient reports that they got into an argument 2 weeks ago and patient reports that she believes they have now broken up and she will no longer be at  the home when patient returns. Patient reports that she moved to [place] "for more sun" as she had read online that this can be beneficial for depression. Patient reports that her mother, sister, and brother also moved her from [place] before patient, and she currently has them for her support system. She admits to daily THC use (1/2-1 blunt per day) and daily alcohol use(up to a fifth per day with need for eye opener in mornings to start the day).Patient denies any true symptoms of prior manic or hypomanic episode. Patient denies symptoms of PTSD.   Reviewed Labs: EtOH 217, HgbA1c- 4.7, HIV ab (-), Lipid panel WNL, UA- does not indicate UTI, U Preg (-), UDS (+ THC), GC/Chlamydia- pending EKG- QTC 416 (12/6)"   Principal Problem: MDD (major depressive disorder), recurrent severe, without psychosis ([facility]) Discharge Diagnoses: Principal Problem:   MDD (major depressive disorder), recurrent severe, without psychosis ([facility]) Active Problems:   Alcohol use disorder, severe, dependence ([facility])   Cannabis abuse   Past Psychiatric History: See H&P  Past Medical History: History reviewed. No pertinent past medical history. History reviewed. No pertinent surgical history. Family History: History reviewed. No pertinent family history. Family Psychiatric  History: See H&P Social History:  Social History   Substance and Sexual Activity  Alcohol Use Yes   Comment: 1/5th liquor daily     Social History   Substance and Sexual Activity  Drug Use Yes   Types: Marijuana    Social History   Socioeconomic History   Marital status: Single    Spouse name: Not on file   Number of children:  Not on file   Years of education: Not on file   Highest education level: Not on file  Occupational History   Not on file  Tobacco Use   Smoking status: Former   Smokeless tobacco: Never  Vaping Use   Vaping Use: Every day   Substances: Nicotine, Flavoring  Substance and Sexual Activity   Alcohol use: Yes    Comment:  1/5th liquor daily   Drug use: Yes    Types: Marijuana   Sexual activity: Not on file  Other Topics Concern   Not on file  Social History Narrative   Not on file   Social Determinants of Health   Financial Resource Strain: Not on file  Food Insecurity: Not on file  Transportation Needs: Not on file  Physical Activity: Not on file  Stress: Not on file  Social Connections: Not on file    Hospital Course: After the above admission evaluation, Sandee's presenting symptoms were noted. She was recommended for mood stabilization treatments. The medication regimen targeting those presenting symptoms were discussed with her & initiated with her consent: Zoloft 25mg  for depression, Ativan Detox Protocol with scheduled Ativan taper and Ativan 1mg  for CIWA score >10; oral MVI and thiamine replacement, and Gabapentin 100mg  TID to help with cravings, potential withdrawal and anxiety. Her UDS on arrival to the ED was pos THC, BAL 217  however, she did not develop any alcohol withdrawal symptoms & did not require alcohol detoxification treatments (although in place as mentioned above). She was however medicated, stabilized & discharged on the medications as listed on her discharge medication lists below. Besides the mood stabilization treatments Hailley was also enrolled & participated in the group counseling sessions being offered & held on this unit. She learned coping skills. She presented no other significant pre-existing medical issues that required treatment. She tolerated her treatment regimen without any adverse effects or reactions reported.   During the course of her hospitalization, the 15-minute checks were adequate to ensure patient's safety.  Jomara did not display any dangerous, violent or suicidal behavior on the unit.  She interacted with patients & staff appropriately, participated appropriately in the group sessions/therapies. Her medications were addressed & adjusted to meet her needs. She  was recommended for outpatient follow-up care & medication management upon discharge to assure continuity of care & mood stability.  At the time of discharge patient is not reporting any acute suicidal/homicidal ideations. She feels more confident about her self-care & in managing her mental health. She currently denies any new issues or concerns. Education and supportive counseling provided throughout her hospital stay & upon discharge.   Today upon her discharge evaluation with the attending psychiatrist Capricia shares she is doing well. She denies any other specific concerns. She is sleeping well. Her appetite is good. She denies other physical complaints. She denies AH/VH, delusional thoughts or paranoia. She does not appear to be responding to any internal stimuli. She feels that her medications have been helpful & is in agreement to continue her current treatment regimen as recommended. She was able to engage in safety planning including plan to return to [facility] or contact emergency services if she feels unable to maintain her own safety or the safety of others. Patient had no further questions, comments, or concerns. She left [facility] with all personal belongings in no apparent distress. Transportation per mother.    Physical Findings: AIMS: Facial and Oral Movements Muscles of Facial Expression: None, normal Lips and Perioral Area: None, normal Jaw:  None, normal Tongue: None, normal,Extremity Movements Upper (arms, wrists, hands, fingers): None, normal Lower (legs, knees, ankles, toes): None, normal, Trunk Movements Neck, shoulders, hips: None, normal, Overall Severity Severity of abnormal movements (highest score from questions above): None, normal Incapacitation due to abnormal movements: None, normal Patient's awareness of abnormal movements (rate only patient's report): No Awareness, Dental Status Current problems with teeth and/or dentures?: No Does patient usually wear dentures?: No  CIWA:   CIWA-Ar Total: 0  Musculoskeletal: Strength & Muscle Tone: within normal limits Gait & Station: normal, steady Patient leans: N/A   Psychiatric Specialty Exam:  Presentation  General Appearance: Appropriate for Environment  Eye Contact:Good  Speech:Clear and Coherent; Normal Rate  Speech Volume:Normal  Handedness:Right   Mood and Affect  Mood:Euthymic  Affect:Appropriate; Congruent   Thought Process  Thought Processes:Coherent; Goal Directed  Descriptions of Associations:Intact  Orientation:Full (Time, Place and Person)  Thought Content:WDL; Logical; denies SI/HI/AVH, paranoia, no delusions.  History of Schizophrenia/Schizoaffective disorder:No  Duration of Psychotic Symptoms:No data recorded Hallucinations:Hallucinations: None  Ideas of Reference:None  Suicidal Thoughts:Suicidal Thoughts: No  Homicidal Thoughts:Homicidal Thoughts: No   Sensorium  Memory:Immediate Good; Recent Good; Remote Good  Judgment: Good  Insight:Fair   Executive Functions  Concentration:Good  Attention Span:Good  [place] of Knowledge:Good  Language:Good   Psychomotor Activity  Psychomotor Activity:Psychomotor Activity: Normal   Assets  Assets:Communication Skills; Desire for Improvement; Financial Resources/Insurance; Housing; Resilience; Social Support   Sleep  Sleep:Sleep: Good    Physical Exam: Vitals and nursing note reviewed.  Constitutional:      General: She is not in acute distress.    Appearance: Normal appearance. She is not toxic-appearing or diaphoretic.  HENT:     Head: Normocephalic and atraumatic.  Cardiovascular:     Rate and Rhythm: Normal rate.  Pulmonary:     Effort: Pulmonary effort is normal.  Musculoskeletal:        General: Normal range of motion.     Cervical back: Normal range of motion.  Skin:    General: Skin is warm and dry.  Neurological:     General: No focal deficit present.     Mental Status: She is  alert.     Motor: No weakness.     Gait: Gait normal.    Review of Systems  Constitutional:  Negative for chills, diaphoresis and malaise/fatigue.  HENT:  Negative for congestion.   Respiratory:  Negative for shortness of breath.   Cardiovascular:  Negative for chest pain.  Gastrointestinal: Negative.   Genitourinary: Negative.   Musculoskeletal: Negative.   Neurological:  Negative for dizziness, tingling, tremors, weakness and headaches.  Blood pressure 105/86, pulse 83, temperature 97.7 F (36.5 C), temperature source Oral, resp. rate 18, height 5\' 6"  (1.676 m), weight 65.8 kg, SpO2 100 %. Body mass index is 23.4 kg/m.   Social History   Tobacco Use  Smoking Status Former  Smokeless Tobacco Never   Tobacco Cessation:  N/A, patient does not currently use tobacco products   Blood Alcohol level:  Lab Results  Component Value Date   ETH 217 (H) [id number]    Metabolic Disorder Labs:  Lab Results  Component Value Date   HGBA1C 4.7 (L) 10/03/2021   MPG 88.19 10/03/2021   No results found for: PROLACTIN Lab Results  Component Value Date   CHOL 146 10/03/2021   TRIG 53 10/03/2021   HDL 92 10/03/2021   CHOLHDL 1.6 10/03/2021   VLDL 11 10/03/2021   LDLCALC 43 10/03/2021  See Psychiatric Specialty Exam and Suicide Risk Assessment completed by Attending Physician prior to discharge.  Discharge destination:  Home  Is patient on multiple antipsychotic therapies at discharge:  No   Has Patient had three or more failed trials of antipsychotic monotherapy by history:  No  Recommended Plan for Multiple Antipsychotic Therapies: NA   Allergies as of 10/09/2021   No Known Allergies      Medication List     STOP taking these medications    oxyCODONE-acetaminophen 5-325 MG tablet Commonly known as: PERCOCET/ROXICET   sulfamethoxazole-trimethoprim 800-160 MG tablet Commonly known as: BACTRIM DS       TAKE these medications      Indication  gabapentin 600  MG tablet Commonly known as: NEURONTIN Take 0.5 tablets (300 mg total) by mouth 2 (two) times daily.  Indication: Abuse or Misuse of Alcohol   ibuprofen 200 MG tablet Commonly known as: ADVIL Take 200 mg by mouth every 6 (six) hours as needed for moderate pain, mild pain or headache.  Indication: Pain   sertraline 25 MG tablet Commonly known as: ZOLOFT Take 1 tablet (25 mg total) by mouth daily. Start taking on: October 10, 2021  Indication: Major Depressive Disorder   thiamine 100 MG tablet Take 1 tablet (100 mg total) by mouth daily. Start taking on: October 10, 2021  Indication: Deficiency of Vitamin B1        Follow-up Information     BEHAVIORAL HEALTH OUTPATIENT THERAPY [place] Follow up on 10/19/2021.   Specialty: Behavioral Health Why: You have an appointment for therapy services on 10/19/21 at 1:20 pm.  This appointment will be held in person. Contact information: [place] [id number] [place] [facility] Go on 11/03/2021.   Why: You have an appointment for medication management services on 11/03/20 at 11:00 am.   This appointment will be held in person. Contact information: [place] [address] [phone]                 Follow-up recommendations:  Activity:  Normal, as tolerated Diet:  Per PCP recommendation  Comment:  Patient is instructed prior to discharge to: Take all medications as prescribed by her mental healthcare provider. Report any adverse effects and/or reactions from the medicines to her outpatient provider promptly. Patient has been instructed & cautioned: To not engage in alcohol and or illegal drug use while on prescription medicines.  In the event of worsening symptoms, patient is instructed to call the crisis hotline at 988, 911 and or go to the nearest ED for appropriate evaluation and treatment of symptoms. To follow-up with  her primary care provider for your other medical issues, concerns and or health care needs.   Signed: Rosezetta Schlatter, MD 10/09/2021, 5:52 PM

## 2021-10-12 ENCOUNTER — Telehealth (HOSPITAL_COMMUNITY): Payer: Self-pay

## 2021-10-12 NOTE — BH Assessment (Signed)
Care Management - [facility] Follow Up [facility] Discharges   Patient has been placed in an inpatient psychiatric hospital [facility]) on 10-03-2021.

## 2021-10-19 ENCOUNTER — Ambulatory Visit (HOSPITAL_COMMUNITY): Payer: BC Managed Care – PPO | Admitting: Clinical

## 2021-11-15 ENCOUNTER — Ambulatory Visit (INDEPENDENT_AMBULATORY_CARE_PROVIDER_SITE_OTHER): Payer: BC Managed Care – PPO | Admitting: Clinical

## 2021-11-15 DIAGNOSIS — F1010 Alcohol abuse, uncomplicated: Secondary | ICD-10-CM | POA: Diagnosis not present

## 2021-11-15 DIAGNOSIS — F39 Unspecified mood [affective] disorder: Secondary | ICD-10-CM

## 2021-11-15 DIAGNOSIS — F1210 Cannabis abuse, uncomplicated: Secondary | ICD-10-CM | POA: Diagnosis not present

## 2021-11-15 NOTE — Plan of Care (Signed)
Pt will develop and implement healthy coping methods to manage depressive sxs as evidenced by going for walks and listening to soothing music 2 out of 7 days per week for up to 30 minutes. Pt participated in completion of treatment plan.
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
## 2021-12-04 ENCOUNTER — Ambulatory Visit: Payer: Self-pay | Admitting: Surgery

## 2021-12-04 NOTE — H&P (Signed)
Chief Complaint: Abscess (Right breast)       History of Present Illness: Ashley Bautista is a 36 y.o. female who is seen today as an office consultation at the request of Dr. Pasty Arch for evaluation of Abscess (Right breast) .     This is a 36 year old female who was seen in our office several months ago with a right breast abscess.  She had aspiration of this abscess by the breast center.  She had some recurrence in November that was treated with oral antibiotics.  A bedside incision and drainage in the emergency department released some purulent fluid.  The area healed up and had been doing well until about 2 weeks ago.  She had worsening pain and swelling as well as erythema of the medial right breast.  This area spontaneously burst and drained some purulent fluid.  It continues to drain a small amount.  She is currently on antibiotics.     Review of Systems: A complete review of systems was obtained from the patient.  I have reviewed this information and discussed as appropriate with the patient.  See HPI as well for other ROS.   Review of Systems  Constitutional: Negative.   HENT: Negative.   Eyes: Negative.   Respiratory: Negative.   Cardiovascular: Negative.   Gastrointestinal: Negative.   Genitourinary: Negative.   Musculoskeletal: Negative.   Skin: Negative.   Neurological: Negative.   Endo/Heme/Allergies: Negative.   Psychiatric/Behavioral: Negative.         Medical History: Past Medical History  History reviewed. No pertinent past medical history.        Patient Active Problem List  Diagnosis   Abscess of right breast      Past Surgical History  History reviewed. No pertinent surgical history.      Allergies  No Known Allergies           Current Outpatient Medications on File Prior to Visit  Medication Sig Dispense Refill   gabapentin (NEURONTIN) 600 MG tablet Take by mouth       doxycycline (VIBRAMYCIN) 100 MG capsule doxycycline hyclate 100 mg capsule   Take 1 capsule twice a day by oral route for 10 days.       sulfamethoxazole-trimethoprim (BACTRIM DS) 800-160 mg tablet Take 1 tablet (160 mg of trimethoprim total) by mouth 2 (two) times daily        No current facility-administered medications on file prior to visit.      Family History  History reviewed. No pertinent family history.      Social History        Tobacco Use  Smoking Status Every Day   Types: Cigarettes  Smokeless Tobacco Never      Social History  Social History         Socioeconomic History   Marital status: Unknown  Tobacco Use   Smoking status: Every Day      Types: Cigarettes   Smokeless tobacco: Never  Substance and Sexual Activity   Alcohol use: Yes   Drug use: Yes      Types: Marijuana        Objective:         Vitals:    12/04/21 0953  BP: 122/84  Pulse: 91  Temp: 37.6 C (99.6 F)  SpO2: 100%  Weight: 70 kg (154 lb 6.4 oz)  Height: 167.6 cm (5\' 6" )    Body mass index is 24.92 kg/m.   Physical Exam    Constitutional:  WDWN  in NAD, conversant, no obvious deformities; lying in bed comfortably Eyes:  Pupils equal, round; sclera anicteric; moist conjunctiva; no lid lag HENT:  Oral mucosa moist; good dentition  Neck:  No masses palpated, trachea midline; no thyromegaly Lungs:  CTA bilaterally; normal respiratory effort Breasts: Right breast shows some mild erythema and induration of the medial part of the breast just adjacent to the areola.  There is a pinpoint opening with some purulent drainage.  However there is no fluctuance palpated today. CV:  Regular rate and rhythm; no murmurs; extremities well-perfused with no edema Abd:  +bowel sounds, soft, non-tender, no palpable organomegaly; no palpable hernias Musc:  Unable to assess gait; no apparent clubbing or cyanosis in extremities Lymphatic:  No palpable cervical or axillary lymphadenopathy Skin:  Warm, dry; no sign of jaundice Psychiatric - alert and oriented x 4; calm mood  and affect       Assessment and Plan:  Diagnoses and all orders for this visit:   Abscess of right breast       Recommend incision and drainage of right breast abscess.  We will probably closed the wound over a Penrose drain to allow continued drainage.The surgical procedure has been discussed with the patient.  Potential risks, benefits, alternative treatments, and expected outcomes have been explained.  All of the patient's questions at this time have been answered.  The likelihood of reaching the patient's treatment goal is good.  The patient understand the proposed surgical procedure and wishes to proceed.   We will schedule this urgently.   No follow-ups on file.   Khamarion Bjelland Jearld Adjutant, MD  12/04/2021 10:08 AM

## 2021-12-04 NOTE — H&P (View-Only) (Signed)
Chief Complaint: Abscess (Right breast) °  °  °  °History of Present Illness: °Ashley Bautista is a 35 y.o. female who is seen today as an office consultation at the request of Dr. Self for evaluation of Abscess (Right breast) °.   °  °This is a 35-year-old female who was seen in our office several months ago with a right breast abscess.  She had aspiration of this abscess by the breast center.  She had some recurrence in November that was treated with oral antibiotics.  A bedside incision and drainage in the emergency department released some purulent fluid.  The area healed up and had been doing well until about 2 weeks ago.  She had worsening pain and swelling as well as erythema of the medial right breast.  This area spontaneously burst and drained some purulent fluid.  It continues to drain a small amount.  She is currently on antibiotics. °  °  °Review of Systems: °A complete review of systems was obtained from the patient.  I have reviewed this information and discussed as appropriate with the patient.  See HPI as well for other ROS. °  °Review of Systems  °Constitutional: Negative.   °HENT: Negative.   °Eyes: Negative.   °Respiratory: Negative.   °Cardiovascular: Negative.   °Gastrointestinal: Negative.   °Genitourinary: Negative.   °Musculoskeletal: Negative.   °Skin: Negative.   °Neurological: Negative.   °Endo/Heme/Allergies: Negative.   °Psychiatric/Behavioral: Negative.   °  °  °  °Medical History: °Past Medical History  °History reviewed. No pertinent past medical history.  ° °  °   °Patient Active Problem List  °Diagnosis  ° Abscess of right breast  °  °  °Past Surgical History  °History reviewed. No pertinent surgical history.  °  °  °Allergies  °No Known Allergies  ° °  °      °Current Outpatient Medications on File Prior to Visit  °Medication Sig Dispense Refill  ° gabapentin (NEURONTIN) 600 MG tablet Take by mouth      ° doxycycline (VIBRAMYCIN) 100 MG capsule doxycycline hyclate 100 mg capsule °  Take 1 capsule twice a day by oral route for 10 days.      ° sulfamethoxazole-trimethoprim (BACTRIM DS) 800-160 mg tablet Take 1 tablet (160 mg of trimethoprim total) by mouth 2 (two) times daily      °  °No current facility-administered medications on file prior to visit.  °  °  °Family History  °History reviewed. No pertinent family history.  °  °  °Social History  °  °    °Tobacco Use  °Smoking Status Every Day  ° Types: Cigarettes  °Smokeless Tobacco Never  °  °  °Social History  °Social History  °  °     °Socioeconomic History  ° Marital status: Unknown  °Tobacco Use  ° Smoking status: Every Day  °    Types: Cigarettes  ° Smokeless tobacco: Never  °Substance and Sexual Activity  ° Alcohol use: Yes  ° Drug use: Yes  °    Types: Marijuana  °  °  °  °Objective:  °  °  °   °Vitals:  °  12/04/21 0953  °BP: 122/84  °Pulse: 91  °Temp: 37.6 °C (99.6 °F)  °SpO2: 100%  °Weight: 70 kg (154 lb 6.4 oz)  °Height: 167.6 cm (5' 6")  °  °Body mass index is 24.92 kg/m². °  °Physical Exam  °  °Constitutional:  WDWN   in NAD, conversant, no obvious deformities; lying in bed comfortably °Eyes:  Pupils equal, round; sclera anicteric; moist conjunctiva; no lid lag °HENT:  Oral mucosa moist; good dentition  °Neck:  No masses palpated, trachea midline; no thyromegaly °Lungs:  CTA bilaterally; normal respiratory effort °Breasts: Right breast shows some mild erythema and induration of the medial part of the breast just adjacent to the areola.  There is a pinpoint opening with some purulent drainage.  However there is no fluctuance palpated today. °CV:  Regular rate and rhythm; no murmurs; extremities well-perfused with no edema °Abd:  +bowel sounds, soft, non-tender, no palpable organomegaly; no palpable hernias °Musc:  Unable to assess gait; no apparent clubbing or cyanosis in extremities °Lymphatic:  No palpable cervical or axillary lymphadenopathy °Skin:  Warm, dry; no sign of jaundice °Psychiatric - alert and oriented x 4; calm mood  and affect °  °  °  °Assessment and Plan:  °Diagnoses and all orders for this visit: °  °Abscess of right breast °  °  °  °Recommend incision and drainage of right breast abscess.  We will probably closed the wound over a Penrose drain to allow continued drainage.The surgical procedure has been discussed with the patient.  Potential risks, benefits, alternative treatments, and expected outcomes have been explained.  All of the patient's questions at this time have been answered.  The likelihood of reaching the patient's treatment goal is good.  The patient understand the proposed surgical procedure and wishes to proceed. °  °We will schedule this urgently. °  °No follow-ups on file. °  °Calab Sachse KAI Jazyah Butsch, MD  °12/04/2021 °10:08 AM ° °

## 2021-12-05 ENCOUNTER — Other Ambulatory Visit: Payer: Self-pay

## 2021-12-05 ENCOUNTER — Ambulatory Visit (INDEPENDENT_AMBULATORY_CARE_PROVIDER_SITE_OTHER): Payer: BC Managed Care – PPO | Admitting: Clinical

## 2021-12-05 DIAGNOSIS — F39 Unspecified mood [affective] disorder: Secondary | ICD-10-CM

## 2021-12-05 NOTE — Progress Notes (Signed)
° °  THERAPIST PROGRESS NOTE  Session Time: 3pm  Participation Level: Active  Behavioral Response: Casual and NeatAlertpleasant  Type of Therapy: Individual Therapy  Treatment Goals addressed: Coping  Interventions: CBT  Summary: Pt presents in pleasant mood. Pt states improvement in her mood, rating it 8/10(10=exceptional). Pt says she is becoming more expressive and not harboring feelings. Per pt report, she is scheduled to have breast surgery this month. No safety concerns reported. Pt continues to work toward achieving treatment goals.  Suicidal/Homicidal: Pt denies SI/HI no plan, intent or attempt to harm self or others reported.  Therapist Response: Reviewed and provided information on the cognitive triangle. Reviewed and provided information on wise mind to assist with managing distressing events. Actively listened as pt identified healthy distractions used to avoid conflict such as walking dog, taking a nap, removing self from situation. Prompted pt to identify consequences received from impulsive decision making. Pt reports conflict.  Plan: Return again in 2 weeks.  Diagnosis: Axis I: Mood disorder    Axis II: No diagnosis    Suzan Slick, LCSW 12/05/2021

## 2021-12-06 ENCOUNTER — Encounter (HOSPITAL_COMMUNITY): Payer: Self-pay | Admitting: Surgery

## 2021-12-06 NOTE — Progress Notes (Addendum)
Multiple attempts to contact the pt, LVM with pre-op instructions to arrive at 0630 to Entrance "A" at [facility], nothing to eat or drink after midnight. See shadow chart for medication list.

## 2021-12-07 ENCOUNTER — Ambulatory Visit (HOSPITAL_COMMUNITY)
Admission: RE | Admit: 2021-12-07 | Discharge: 2021-12-07 | Disposition: A | Discharge status: Home / Self Care | Payer: BC Managed Care – PPO | Source: Ambulatory Visit | Attending: Surgery | Admitting: Surgery

## 2021-12-07 ENCOUNTER — Ambulatory Visit (HOSPITAL_COMMUNITY): Payer: BC Managed Care – PPO | Admitting: General Practice

## 2021-12-07 ENCOUNTER — Encounter (HOSPITAL_COMMUNITY): Payer: Self-pay | Admitting: Surgery

## 2021-12-07 ENCOUNTER — Encounter (HOSPITAL_COMMUNITY)
Admission: RE | Disposition: A | Discharge status: Home / Self Care | Payer: Self-pay | Source: Ambulatory Visit | Attending: Surgery

## 2021-12-07 ENCOUNTER — Other Ambulatory Visit: Payer: Self-pay

## 2021-12-07 ENCOUNTER — Telehealth: Payer: Self-pay | Admitting: *Deleted

## 2021-12-07 DIAGNOSIS — F1290 Cannabis use, unspecified, uncomplicated: Secondary | ICD-10-CM | POA: Insufficient documentation

## 2021-12-07 DIAGNOSIS — N611 Abscess of the breast and nipple: Secondary | ICD-10-CM | POA: Diagnosis not present

## 2021-12-07 DIAGNOSIS — F17210 Nicotine dependence, cigarettes, uncomplicated: Secondary | ICD-10-CM | POA: Diagnosis not present

## 2021-12-07 DIAGNOSIS — F32A Depression, unspecified: Secondary | ICD-10-CM | POA: Insufficient documentation

## 2021-12-07 HISTORY — PX: INCISION AND DRAINAGE ABSCESS: SHX5864

## 2021-12-07 LAB — COMPREHENSIVE METABOLIC PANEL
ALT: 22 U/L (ref 0–44)
AST: 43 U/L — ABNORMAL HIGH (ref 15–41)
Albumin: 4.1 g/dL (ref 3.5–5.0)
Alkaline Phosphatase: 37 U/L — ABNORMAL LOW (ref 38–126)
Anion gap: 8 (ref 5–15)
BUN: 6 mg/dL (ref 6–20)
CO2: 26 mmol/L (ref 22–32)
Calcium: 9.1 mg/dL (ref 8.9–10.3)
Chloride: 104 mmol/L (ref 98–111)
Creatinine, Ser: 0.78 mg/dL (ref 0.44–1.00)
GFR, Estimated: >60 mL/min (ref >60)
Glucose, Bld: 86 mg/dL (ref 70–99)
Potassium: 4.1 mmol/L (ref 3.5–5.1)
Sodium: 138 mmol/L (ref 135–145)
Total Bilirubin: 1.8 mg/dL — ABNORMAL HIGH (ref 0.3–1.2)
Total Protein: 6.6 g/dL (ref 6.5–8.1)

## 2021-12-07 LAB — POCT PREGNANCY, URINE: Preg Test, Ur: NEGATIVE

## 2021-12-07 SURGERY — INCISION AND DRAINAGE, ABSCESS
Anesthesia: General | Site: Breast | Laterality: Right

## 2021-12-07 MED ORDER — AMISULPRIDE (ANTIEMETIC) 5 MG/2ML IV SOLN: 10.0000 mg | Freq: Once | INTRAVENOUS | Status: DC | PRN

## 2021-12-07 MED ORDER — PROPOFOL 10 MG/ML IV BOLUS
INTRAVENOUS | Status: AC
  Filled 2021-12-07: qty 20

## 2021-12-07 MED ORDER — CHLORHEXIDINE GLUCONATE 0.12 % MT SOLN
OROMUCOSAL | Status: AC
  Administered 2021-12-07: 15 mL via OROMUCOSAL
  Filled 2021-12-07: qty 15

## 2021-12-07 MED ORDER — HYDROCODONE-ACETAMINOPHEN 5-325 MG PO TABS: 1.0000 | ORAL_TABLET | Freq: Four times a day (QID) | ORAL | 0 refills | Status: DC | PRN

## 2021-12-07 MED ORDER — LACTATED RINGERS IV SOLN: INTRAVENOUS | Status: DC

## 2021-12-07 MED ORDER — ACETAMINOPHEN 500 MG PO TABS: 1000.0000 mg | ORAL_TABLET | ORAL | Status: AC

## 2021-12-07 MED ORDER — CHLORHEXIDINE GLUCONATE 0.12 % MT SOLN: 15.0000 mL | Freq: Once | OROMUCOSAL | Status: AC

## 2021-12-07 MED ORDER — OXYCODONE HCL 5 MG PO TABS: 5.0000 mg | ORAL_TABLET | Freq: Once | ORAL | Status: DC | PRN

## 2021-12-07 MED ORDER — DEXMEDETOMIDINE (PRECEDEX) IN NS 20 MCG/5ML (4 MCG/ML) IV SYRINGE
PREFILLED_SYRINGE | INTRAVENOUS | Status: DC | PRN
  Administered 2021-12-07: 8 ug via INTRAVENOUS
  Administered 2021-12-07: 4 ug via INTRAVENOUS
  Administered 2021-12-07: 8 ug via INTRAVENOUS

## 2021-12-07 MED ORDER — 0.9 % SODIUM CHLORIDE (POUR BTL) OPTIME
TOPICAL | Status: DC | PRN
  Administered 2021-12-07: 1000 mL

## 2021-12-07 MED ORDER — DEXAMETHASONE SODIUM PHOSPHATE 10 MG/ML IJ SOLN
INTRAMUSCULAR | Status: DC | PRN
Start: 2021-12-07 — End: 2021-12-07
  Administered 2021-12-07: 10 mg via INTRAVENOUS

## 2021-12-07 MED ORDER — HYDROCODONE-ACETAMINOPHEN 5-325 MG PO TABS
1.0000 | ORAL_TABLET | ORAL | 0 refills | Status: DC | PRN
Start: 2021-12-07 — End: 2023-05-30

## 2021-12-07 MED ORDER — PHENYLEPHRINE 40 MCG/ML (10ML) SYRINGE FOR IV PUSH (FOR BLOOD PRESSURE SUPPORT)
PREFILLED_SYRINGE | INTRAVENOUS | Status: AC
  Filled 2021-12-07: qty 10

## 2021-12-07 MED ORDER — ONDANSETRON HCL 4 MG/2ML IJ SOLN
INTRAMUSCULAR | Status: DC | PRN
  Administered 2021-12-07: 4 mg via INTRAVENOUS

## 2021-12-07 MED ORDER — ACETAMINOPHEN 500 MG PO TABS
ORAL_TABLET | ORAL | Status: AC
  Administered 2021-12-07: 1000 mg via ORAL
  Filled 2021-12-07: qty 2

## 2021-12-07 MED ORDER — FENTANYL CITRATE (PF) 250 MCG/5ML IJ SOLN
INTRAMUSCULAR | Status: AC
  Filled 2021-12-07: qty 5

## 2021-12-07 MED ORDER — PROMETHAZINE HCL 25 MG/ML IJ SOLN: 6.2500 mg | INTRAMUSCULAR | Status: DC | PRN

## 2021-12-07 MED ORDER — BUPIVACAINE-EPINEPHRINE (PF) 0.25% -1:200000 IJ SOLN
INTRAMUSCULAR | Status: AC
  Filled 2021-12-07: qty 30

## 2021-12-07 MED ORDER — LIDOCAINE 2% (20 MG/ML) 5 ML SYRINGE
INTRAMUSCULAR | Status: DC | PRN
Start: 2021-12-07 — End: 2021-12-07
  Administered 2021-12-07: 80 mg via INTRAVENOUS

## 2021-12-07 MED ORDER — PROPOFOL 10 MG/ML IV BOLUS
INTRAVENOUS | Status: DC | PRN
  Administered 2021-12-07: 80 mg via INTRAVENOUS
  Administered 2021-12-07: 200 mg via INTRAVENOUS

## 2021-12-07 MED ORDER — FENTANYL CITRATE (PF) 250 MCG/5ML IJ SOLN
INTRAMUSCULAR | Status: DC | PRN
  Administered 2021-12-07 (×2): 50 ug via INTRAVENOUS

## 2021-12-07 MED ORDER — SCOPOLAMINE 1 MG/3DAYS TD PT72: 1.0000 | MEDICATED_PATCH | TRANSDERMAL | Status: DC

## 2021-12-07 MED ORDER — SCOPOLAMINE 1 MG/3DAYS TD PT72
MEDICATED_PATCH | TRANSDERMAL | Status: AC
  Administered 2021-12-07: 1.5 mg via TRANSDERMAL
  Filled 2021-12-07: qty 1

## 2021-12-07 MED ORDER — CEFAZOLIN SODIUM-DEXTROSE 2-4 GM/100ML-% IV SOLN
INTRAVENOUS | Status: AC
  Filled 2021-12-07: qty 100

## 2021-12-07 MED ORDER — CEFAZOLIN SODIUM-DEXTROSE 2-4 GM/100ML-% IV SOLN
2.0000 g | INTRAVENOUS | Status: AC
  Administered 2021-12-07: 2 g via INTRAVENOUS

## 2021-12-07 MED ORDER — MIDAZOLAM HCL 2 MG/2ML IJ SOLN
INTRAMUSCULAR | Status: DC | PRN
Start: 2021-12-07 — End: 2021-12-07
  Administered 2021-12-07: 2 mg via INTRAVENOUS

## 2021-12-07 MED ORDER — FENTANYL CITRATE (PF) 100 MCG/2ML IJ SOLN: 25.0000 ug | INTRAMUSCULAR | Status: DC | PRN

## 2021-12-07 MED ORDER — ORAL CARE MOUTH RINSE: 15.0000 mL | Freq: Once | OROMUCOSAL | Status: AC

## 2021-12-07 MED ORDER — CHLORHEXIDINE GLUCONATE CLOTH 2 % EX PADS: 6.0000 | MEDICATED_PAD | Freq: Once | CUTANEOUS | Status: DC

## 2021-12-07 MED ORDER — BUPIVACAINE-EPINEPHRINE (PF) 0.25% -1:200000 IJ SOLN
INTRAMUSCULAR | Status: DC | PRN
  Administered 2021-12-07: 10 mL

## 2021-12-07 MED ORDER — OXYCODONE HCL 5 MG/5ML PO SOLN: 5.0000 mg | Freq: Once | ORAL | Status: DC | PRN

## 2021-12-07 MED ORDER — MIDAZOLAM HCL 2 MG/2ML IJ SOLN
INTRAMUSCULAR | Status: AC
  Filled 2021-12-07: qty 2

## 2021-12-07 SURGICAL SUPPLY — 32 items
BAG COUNTER SPONGE SURGICOUNT (BAG) ×2 IMPLANT
BLADE CLIPPER SURG (BLADE) IMPLANT
BNDG GAUZE ELAST 4 BULKY (GAUZE/BANDAGES/DRESSINGS) IMPLANT
CANISTER SUCT 3000ML PPV (MISCELLANEOUS) ×2 IMPLANT
CHLORAPREP W/TINT 26 (MISCELLANEOUS) ×2 IMPLANT
COVER SURGICAL LIGHT HANDLE (MISCELLANEOUS) ×2 IMPLANT
DRAIN PENROSE 18X1/4 LTX STRL (DRAIN) ×1 IMPLANT
DRAPE LAPAROSCOPIC ABDOMINAL (DRAPES) IMPLANT
DRAPE LAPAROTOMY 100X72 PEDS (DRAPES) ×1 IMPLANT
DRSG PAD ABDOMINAL 8X10 ST (GAUZE/BANDAGES/DRESSINGS) IMPLANT
ELECT CAUTERY BLADE 6.4 (BLADE) ×1 IMPLANT
ELECT REM PT RETURN 9FT ADLT (ELECTROSURGICAL) ×2
ELECTRODE REM PT RTRN 9FT ADLT (ELECTROSURGICAL) ×1 IMPLANT
GAUZE SPONGE 4X4 12PLY STRL (GAUZE/BANDAGES/DRESSINGS) ×1 IMPLANT
GLOVE SURG ENC MOIS LTX SZ7 (GLOVE) ×2 IMPLANT
GLOVE SURG UNDER POLY LF SZ7.5 (GLOVE) ×2 IMPLANT
GOWN STRL REUS W/ TWL LRG LVL3 (GOWN DISPOSABLE) ×2 IMPLANT
GOWN STRL REUS W/TWL LRG LVL3 (GOWN DISPOSABLE) ×2
KIT BASIN OR (CUSTOM PROCEDURE TRAY) ×2 IMPLANT
KIT TURNOVER KIT B (KITS) ×2 IMPLANT
NDL HYPO 25GX1X1/2 BEV (NEEDLE) IMPLANT
NEEDLE HYPO 25GX1X1/2 BEV (NEEDLE) ×2 IMPLANT
NS IRRIG 1000ML POUR BTL (IV SOLUTION) ×2 IMPLANT
PACK GENERAL/GYN (CUSTOM PROCEDURE TRAY) ×2 IMPLANT
PAD ARMBOARD 7.5X6 YLW CONV (MISCELLANEOUS) ×2 IMPLANT
PENCIL SMOKE EVACUATOR (MISCELLANEOUS) ×1 IMPLANT
SUT ETHILON 3 0 FSL (SUTURE) ×1 IMPLANT
SWAB COLLECTION DEVICE MRSA (MISCELLANEOUS) ×1 IMPLANT
SWAB CULTURE ESWAB REG 1ML (MISCELLANEOUS) ×1 IMPLANT
SYR CONTROL 10ML LL (SYRINGE) ×1 IMPLANT
TOWEL GREEN STERILE (TOWEL DISPOSABLE) ×2 IMPLANT
TOWEL GREEN STERILE FF (TOWEL DISPOSABLE) ×2 IMPLANT

## 2021-12-07 NOTE — Anesthesia Preprocedure Evaluation (Addendum)
Anesthesia Evaluation  Patient identified by MRN, date of birth, ID band Patient awake    Reviewed: Allergy & Precautions, NPO status , Patient's Chart, lab work & pertinent test results  Airway Mallampati: II  TM Distance: >3 FB Neck ROM: Full    Dental no notable dental hx.    Pulmonary Current SmokerPatient did not abstain from smoking., former smoker,    Pulmonary exam normal breath sounds clear to auscultation       Cardiovascular Exercise Tolerance: Good negative cardio ROS Normal cardiovascular exam Rhythm:Regular Rate:Normal     Neuro/Psych PSYCHIATRIC DISORDERS Depression negative neurological ROS     GI/Hepatic negative GI ROS, Neg liver ROS, (+)     substance abuse  alcohol use and marijuana use,   Endo/Other  negative endocrine ROS  Renal/GU negative Renal ROS  negative genitourinary   Musculoskeletal negative musculoskeletal ROS (+)   Abdominal   Peds negative pediatric ROS (+)  Hematology negative hematology ROS (+)   Anesthesia Other Findings   Reproductive/Obstetrics negative OB ROS                            Anesthesia Physical Anesthesia Plan  ASA: 2  Anesthesia Plan: General   Post-op Pain Management: Tylenol PO (pre-op) and Minimal or no pain anticipated   Induction:   PONV Risk Score and Plan: 3 and Treatment may vary due to age or medical condition, Midazolam, Scopolamine patch - Pre-op, Ondansetron and Dexamethasone  Airway Management Planned: LMA  Additional Equipment: None  Intra-op Plan:   Post-operative Plan: Extubation in OR  Informed Consent: I have reviewed the patients History and Physical, chart, labs and discussed the procedure including the risks, benefits and alternatives for the proposed anesthesia with the patient or authorized representative who has indicated his/her understanding and acceptance.     Dental advisory given  Plan  Discussed with: CRNA, Anesthesiologist and Surgeon  Anesthesia Plan Comments:         Anesthesia Quick Evaluation

## 2021-12-07 NOTE — Interval H&P Note (Signed)
History and Physical Interval Note:  12/07/2021 8:48 AM  Ashley Bautista  has presented today for surgery, with the diagnosis of CHRONIC RIGHT BREAST ABSCESS.  The various methods of treatment have been discussed with the patient and family. After consideration of risks, benefits and other options for treatment, the patient has consented to  Procedure(s): INCISION AND DRAINAGE OF RIGHT BREAST ABSCESS (Right) as a surgical intervention.  The patient's history has been reviewed, patient examined, no change in status, stable for surgery.  I have reviewed the patient's chart and labs.  Questions were answered to the patient's satisfaction.     Wynona Luna

## 2021-12-07 NOTE — Discharge Instructions (Signed)
You may shower over the dressing.  Change the dressing after your shower.  Otherwise, change the dressing at least once a day to absorb any drainage.  You should expect drainage for the first several days.

## 2021-12-07 NOTE — Transfer of Care (Signed)
Immediate Anesthesia Transfer of Care Note  Patient: Ashley Bautista  Procedure(s) Performed: INCISION AND DRAINAGE OF RIGHT BREAST ABSCESS (Right: Breast)  Patient Location: PACU  Anesthesia Type:General  Level of Consciousness: drowsy  Airway & Oxygen Therapy: Patient Spontanous Breathing  Post-op Assessment: Report given to RN and Post -op Vital signs reviewed and stable  Post vital signs: Reviewed and stable  Last Vitals:  Vitals Value Taken Time  BP 94/52 12/07/21 0940  Temp    Pulse 63 12/07/21 0941  Resp 22 12/07/21 0941  SpO2 96 % 12/07/21 0941  Vitals shown include unvalidated device data.  Last Pain:  Vitals:   12/07/21 0758  TempSrc:   PainSc: 0-No pain         Complications: No notable events documented.

## 2021-12-07 NOTE — Op Note (Signed)
Pre-op diagnosis: Chronic right breast abscess Postop diagnosis: Same Procedure performed: Incision and drainage of chronic right breast abscess Surgeon:Cierria Height K Zymarion Favorite Anesthesia: General Indications:This is a 36 year old female who was seen in our office several months ago with a right breast abscess.  She had aspiration of this abscess by the breast center.  She had some recurrence in November that was treated with oral antibiotics.  A bedside incision and drainage in the emergency department released some purulent fluid.  The area healed up and had been doing well until about 2 weeks ago.  She had worsening pain and swelling as well as erythema of the medial right breast.  This area spontaneously burst and drained some purulent fluid.  It continues to drain a small amount.  She is currently on antibiotics.  She presents now for drainage of this area.  Description of procedure: The patient was brought to the operating room and placed in the supine position on operating room table.  After an adequate level of general anesthesia was obtained, her right breast was prepped with ChloraPrep and draped sterile fashion.  A timeout was taken to ensure the proper patient and proper procedure.  The patient has thickening and induration along the medial portion of her right areola.  We infiltrated this area with local anesthetic.  I made a curvilinear circumareolar incision around the medial portion of her areola.  We dissected in a small pocket of purulent fluid.  This was cultured and sent for microbiology.  I then excised the entire abscess cavity which measured only about 1.5 cm in diameter.  We dissected back to normal-appearing breast tissue.  We irrigated the wound thoroughly and inspected for hemostasis.  A Penrose drain was placed into the deep portion of the wound.  We closed the skin around the Penrose drain using interrupted 3-0 Ethilon sutures.  Ethilon was also used to secure the drain in place.  A dry  dressing is applied.  The patient is then extubated and brought to the recovery room in stable condition.  All sponge, instrument, and needle counts are correct.  Wilmon Arms. Corliss Skains, MD, [facility]  General Surgery   12/07/2021 9:42 AM

## 2021-12-07 NOTE — Anesthesia Procedure Notes (Signed)
Procedure Name: LMA Insertion Date/Time: 12/07/2021 9:05 AM Performed by: Maxine Glenn, CRNA Pre-anesthesia Checklist: Patient identified, Emergency Drugs available, Suction available and Patient being monitored Patient Re-evaluated:Patient Re-evaluated prior to induction Oxygen Delivery Method: Circle System Utilized Preoxygenation: Pre-oxygenation with 100% oxygen Induction Type: IV induction Ventilation: Mask ventilation without difficulty LMA: LMA inserted LMA Size: 4.0 Number of attempts: 1 Airway Equipment and Method: Bite block Placement Confirmation: positive ETCO2 Tube secured with: Tape Dental Injury: Teeth and Oropharynx as per pre-operative assessment

## 2021-12-07 NOTE — Telephone Encounter (Signed)
Pt called regarding pharmacy being out of stock for Rx prescribed after surgery. RNCM sent secure chat to  surgeon to send Rx to alternate pharmacy.

## 2021-12-08 ENCOUNTER — Encounter (HOSPITAL_COMMUNITY): Payer: Self-pay | Admitting: Surgery

## 2021-12-08 LAB — SURGICAL PATHOLOGY

## 2021-12-08 NOTE — Anesthesia Postprocedure Evaluation (Signed)
Anesthesia Post Note  Patient: Ashley Bautista  Procedure(s) Performed: INCISION AND DRAINAGE OF RIGHT BREAST ABSCESS (Right: Breast)     Patient location during evaluation: PACU Anesthesia Type: General Level of consciousness: awake Pain management: pain level controlled Vital Signs Assessment: post-procedure vital signs reviewed and stable Respiratory status: spontaneous breathing and respiratory function stable Cardiovascular status: stable Postop Assessment: no apparent nausea or vomiting Anesthetic complications: no   No notable events documented.  Last Vitals:  Vitals:   12/07/21 0955 12/07/21 1010  BP: 98/67 103/80  Pulse: (!) 59 61  Resp: 13 20  Temp:  36.7 C  SpO2: 96% 99%    Last Pain:  Vitals:   12/07/21 1010  TempSrc:   PainSc: 0-No pain                 Mellody Dance

## 2021-12-12 LAB — AEROBIC/ANAEROBIC CULTURE W GRAM STAIN (SURGICAL/DEEP WOUND): Gram Stain: NONE SEEN

## 2023-05-25 ENCOUNTER — Emergency Department (HOSPITAL_COMMUNITY): Payer: PRIVATE HEALTH INSURANCE

## 2023-05-25 ENCOUNTER — Emergency Department (HOSPITAL_COMMUNITY)
Admission: EM | Admit: 2023-05-25 | Discharge: 2023-05-26 | Disposition: A | Discharge status: Home / Self Care | Payer: PRIVATE HEALTH INSURANCE | Attending: Emergency Medicine | Admitting: Emergency Medicine

## 2023-05-25 ENCOUNTER — Other Ambulatory Visit: Payer: Self-pay

## 2023-05-25 ENCOUNTER — Encounter (HOSPITAL_COMMUNITY): Payer: Self-pay | Admitting: Emergency Medicine

## 2023-05-25 DIAGNOSIS — W2203XA Walked into furniture, initial encounter: Secondary | ICD-10-CM | POA: Diagnosis not present

## 2023-05-25 DIAGNOSIS — S99922A Unspecified injury of left foot, initial encounter: Secondary | ICD-10-CM | POA: Diagnosis present

## 2023-05-25 NOTE — ED Triage Notes (Signed)
Pt reports Tues night she struck her left great toe on the table leg.  Toe has continued to swell, nail is black, and has become more painful.

## 2023-05-26 DIAGNOSIS — S99922A Unspecified injury of left foot, initial encounter: Secondary | ICD-10-CM | POA: Diagnosis not present

## 2023-05-26 MED ORDER — IBUPROFEN 800 MG PO TABS: 800.0000 mg | ORAL_TABLET | Freq: Three times a day (TID) | ORAL | 0 refills | Status: DC

## 2023-05-26 MED ORDER — IBUPROFEN 800 MG PO TABS
800.0000 mg | ORAL_TABLET | Freq: Once | ORAL | Status: AC
Start: 2023-05-26 — End: 2023-05-26
  Administered 2023-05-26: 800 mg via ORAL
  Filled 2023-05-26: qty 1

## 2023-05-26 NOTE — ED Provider Notes (Signed)
[facility]-EMERGENCY DEPT [facility] Emergency Department Provider Note MRN:  [id number]  Arrival date & time: 05/26/23     Chief Complaint   Toe Injury   History of Present Illness   Ashley Bautista is a 37 y.o. year-old female presents to the ED with chief complaint of left great toe pain.  States that she stumbled earlier this week and injured the toe.  She states that she has had pain since then.  She denies any other injuries.  States that she has trouble walking due to the pain.  History provided by patient.   Review of Systems  Pertinent positive and negative review of systems noted in HPI.    Physical Exam   Vitals:   05/25/23 2259  BP: 126/88  Pulse: 100  Resp: 18  Temp: 97.7 F (36.5 C)  SpO2: 97%    CONSTITUTIONAL:  well-appearing, NAD NEURO:  Alert and oriented x 3, CN 3-12 grossly intact EYES:  eyes equal and reactive ENT/NECK:  Supple, no stridor  CARDIO:  normal rate, appears well-perfused  PULM:  No respiratory distress,  GI/GU:  non-distended,  MSK/SPINE:  No gross deformities, no edema, moves all extremities, mild swelling about the 1 MTP joint on the left, no open wounds, TTP, mild swelling to the midfoot SKIN:  no rash, atraumatic, onychomycosis    *Additional and/or pertinent findings included in MDM below  Diagnostic and Interventional Summary    EKG Interpretation Date/Time:    Ventricular Rate:    PR Interval:    QRS Duration:    QT Interval:    QTC Calculation:   R Axis:      Text Interpretation:         Labs Reviewed - No data to display  DG Foot Complete Left  Final Result      Medications  ibuprofen (ADVIL) tablet 800 mg (800 mg Oral Given 05/26/23 0056)     Procedures  /  Critical Care Procedures  ED Course and Medical Decision Making  I have reviewed the triage vital signs, the nursing notes, and pertinent available records from the EMR.  Social Determinants Affecting Complexity of Care: Patient has no clinically  significant social determinants affecting this chief complaint..   ED Course:    Medical Decision Making Patient presents with injury to left great toe.  DDx includes, fracture, strain, or sprain.  Consultants: none  Plain films reveal no fracture or dislocation.  Pt advised to follow up with PCP and/or orthopedics. Patient given post op shoe while in ED, conservative therapy such as RICE recommended and discussed.   Patient will be discharged home & is agreeable with above plan. Returns precautions discussed. Pt appears safe for discharge.   Amount and/or Complexity of Data Reviewed Radiology: ordered and independent interpretation performed.    Details: No fracture or dislocation seen         Consultants: No consultations were needed in caring for this patient.   Treatment and Plan: Emergency department workup does not suggest an emergent condition requiring admission or immediate intervention beyond  what has been performed at this time. The patient is safe for discharge and has  been instructed to return immediately for worsening symptoms, change in  symptoms or any other concerns    Final Clinical Impressions(s) / ED Diagnoses     ICD-10-CM   1. Injury of toe on left foot, initial encounter  M01.027O       ED Discharge Orders  Ordered    ibuprofen (ADVIL) 800 MG tablet  3 times daily        05/26/23 0025              Discharge Instructions Discussed with and Provided to Patient:   Discharge Instructions   None      Roxy Horseman, PA-C 05/26/23 0230    Gilda Crease, MD 05/26/23 0630

## 2023-05-29 ENCOUNTER — Other Ambulatory Visit (HOSPITAL_BASED_OUTPATIENT_CLINIC_OR_DEPARTMENT_OTHER): Payer: Self-pay

## 2023-05-29 ENCOUNTER — Ambulatory Visit (INDEPENDENT_AMBULATORY_CARE_PROVIDER_SITE_OTHER): Payer: PRIVATE HEALTH INSURANCE | Admitting: Student

## 2023-05-29 DIAGNOSIS — M79675 Pain in left toe(s): Secondary | ICD-10-CM

## 2023-05-29 MED ORDER — TRAMADOL HCL 50 MG PO TABS
50.0000 mg | ORAL_TABLET | Freq: Four times a day (QID) | ORAL | 0 refills | Status: DC | PRN
Start: 2023-05-29 — End: 2023-06-02
  Filled 2023-05-29: qty 20, 5d supply, fill #0

## 2023-05-29 NOTE — Progress Notes (Signed)
Chief Complaint: Left great toe pain and swelling     History of Present Illness:    Ashley Bautista is a 37 y.o. female presenting today for evaluation of pain and swelling in the great toe of her left foot.  The pain began after she stubbed her toe approximately 6 days ago.  After pain did not subside and she continued to have difficulty with weightbearing, she was evaluated 4 days ago in the emergency department.  X-rays at that time were negative for fracture and she was given crutches and a postop shoe.  Since this visit, the swelling, pain, and redness of her toe has continued to progressively worsen.  Pain today is severe and she is unable to weight-bear.  She reports some swelling and redness is now extending slightly into the foot.  Denies any previous history of gout.  Has been taking Tylenol and icing which gives limited relief.  Denies any fever or chills.   Surgical History:   None  PMH/PSH/Family History/Social History/Meds/Allergies:   No past medical history on file. Past Surgical History:  Procedure Laterality Date   INCISION AND DRAINAGE ABSCESS Right 12/07/2021   Procedure: INCISION AND DRAINAGE OF RIGHT BREAST ABSCESS;  Surgeon: Manus Rudd, MD;  Location: [facility] OR;  Service: General;  Laterality: Right;   Social History   Socioeconomic History   Marital status: Single    Spouse name: Not on file   Number of children: Not on file   Years of education: Not on file   Highest education level: Not on file  Occupational History   Not on file  Tobacco Use   Smoking status: Former   Smokeless tobacco: Never  Vaping Use   Vaping status: Every Day   Substances: Nicotine, Flavoring  Substance and Sexual Activity   Alcohol use: Yes    Comment: 1/5th liquor daily   Drug use: Yes    Types: Marijuana   Sexual activity: Not on file  Other Topics Concern   Not on file  Social History Narrative   Not on file   Social Determinants of  Health   Financial Resource Strain: Not on file  Food Insecurity: Not on file  Transportation Needs: Not on file  Physical Activity: Not on file  Stress: Not on file  Social Connections: Not on file   No family history on file. No Known Allergies Current Outpatient Medications  Medication Sig Dispense Refill   traMADol (ULTRAM) 50 MG tablet Take 1 tablet (50 mg total) by mouth every 6 (six) hours as needed for up to 5 days for severe pain. 20 tablet 0   doxycycline (VIBRAMYCIN) 100 MG capsule Take 100 mg by mouth 2 (two) times daily.     gabapentin (NEURONTIN) 300 MG capsule Take 300 mg by mouth 2 (two) times daily.     HYDROcodone-acetaminophen (NORCO/VICODIN) 5-325 MG tablet Take 1 tablet by mouth every 4 (four) hours as needed. 20 tablet 0   ibuprofen (ADVIL) 800 MG tablet Take 1 tablet (800 mg total) by mouth 3 (three) times daily. 21 tablet 0   mirtazapine (REMERON) 15 MG tablet Take 15 mg by mouth in the morning.     No current facility-administered medications for this visit.   No results found.  Review of Systems:   A ROS was performed including  pertinent positives and negatives as documented in the HPI.  Physical Exam :   Constitutional: NAD and appears stated age Neurological: Alert and oriented Psych: Appropriate affect and cooperative There were no vitals taken for this visit.   Comprehensive Musculoskeletal Exam:    Left great toe appears severely erythematous and swollen.  This is warm and extremely painful to touch.  Swelling located mainly over first MTP joint and extends over the metatarsals.  Unable to actively flex or extend the toe due to pain.  Passive flexion extension causes significant discomfort.  She does have some soft tissue tenderness to palpation of the ankle.  Corn noted on the plantar aspect of the forefoot.  DP pulses 2+.  Neurosensory exam intact.  Imaging:   Xray review from emergency department on 7/27 (left foot 3 views): Negative for acute  bony abnormality    I personally reviewed and interpreted the radiographs.   Assessment:   37 y.o. female presenting with acute pain, erythema, and swelling of the left great toe and forefoot.  This began after an injury 6 days ago and has been progressively worsening since.  Based on her presentation, I am concerned for a developing infection in the toe and into the forefoot.  Due to this, I would like to urgently have her referred to Dr. Lajoyce Corners for further evaluation and treatment.  Patient's pain is currently uncontrolled on Tylenol, so I will send her some tramadol to help manage this.  Discussed red flag symptoms including development of fever or chills.  Plan :    -Referral to Dr. Lajoyce Corners for further evaluation of possible infection and treatment discussion.     I personally saw and evaluated the patient, and participated in the management and treatment plan.  Hazle Nordmann, PA-C Orthopedics  This document was dictated using Conservation officer, historic buildings. A reasonable attempt at proof reading has been made to minimize errors.

## 2023-05-30 ENCOUNTER — Encounter (HOSPITAL_COMMUNITY): Payer: Self-pay | Admitting: Orthopedic Surgery

## 2023-05-30 ENCOUNTER — Encounter: Payer: Self-pay | Admitting: Orthopedic Surgery

## 2023-05-30 ENCOUNTER — Ambulatory Visit (INDEPENDENT_AMBULATORY_CARE_PROVIDER_SITE_OTHER): Payer: PRIVATE HEALTH INSURANCE | Admitting: Orthopedic Surgery

## 2023-05-30 ENCOUNTER — Other Ambulatory Visit: Payer: Self-pay

## 2023-05-30 DIAGNOSIS — M79672 Pain in left foot: Secondary | ICD-10-CM

## 2023-05-30 DIAGNOSIS — L02612 Cutaneous abscess of left foot: Secondary | ICD-10-CM | POA: Diagnosis not present

## 2023-05-30 NOTE — Progress Notes (Addendum)
Ashley Bautista denies chest pain or shortness of breath.  Patient denies having any s/s of Covid in her household, also denies any known exposure to Covid.  [name] denies  any s/s of upper or lower respiratory in the past 8 weeks.   [name] does not have a PCP.

## 2023-05-30 NOTE — Progress Notes (Signed)
Office Visit Note   Patient: Ashley Bautista           Date of Birth: May 10, 1986           MRN: [id number] Visit Date: 05/30/2023              Requested by: No referring provider defined for this encounter. PCP: Pcp, No  Chief Complaint  Patient presents with   Left Foot - Pain    Great toe pain, swelling, redness      HPI: Patient is a 38 year old woman who was seen for initial evaluation and referral from [facility].  Patient states she first noticed pain and swelling when she stubbed her toe 6 days ago.  Patient was provided a prescription for tramadol radiographs showed no fracture.  Patient presents with increasing redness swelling and purulent drainage.  Assessment & Plan: Visit Diagnoses:  1. Pain in left foot   2. Cutaneous abscess of left foot     Plan: Will plan for surgical debridement of the abscess first webspace.  Discussed with the patient we will admit her postoperatively for IV antibiotics and may need to proceed with additional surgery based on extent of the abscess.  Smoking cessation was discussed.  Follow-Up Instructions: Return in about 2 weeks (around 06/13/2023).   Ortho Exam  Patient is alert, oriented, no adenopathy, well-dressed, normal affect, normal respiratory effort. Examination patient has a strong dorsalis pedis pulse there is a draining abscess in the first webspace with purulent drainage there is ascending cellulitis to the midfoot.  No recent laboratory studies we will obtain a CBC c-Met and hemoglobin A1c.  Calf is soft nontender no evidence of any ascending fasciitis.  Imaging: No results found. No images are attached to the encounter.  Labs: Lab Results  Component Value Date   HGBA1C 4.7 (L) 10/03/2021   REPTSTATUS 12/12/2021 FINAL 12/07/2021   GRAMSTAIN  12/07/2021    NO SQUAMOUS EPITHELIAL CELLS SEEN FEW WBC SEEN NO ORGANISMS SEEN    CULT  12/07/2021    RARE STAPHYLOCOCCUS LUGDUNENSIS NO STAPHYLOCOCCUS AUREUS  ISOLATED NO GROUP A STREP (S.PYOGENES) ISOLATED RARE PREVOTELLA BIVIA BETA LACTAMASE POSITIVE Performed at [facility], [address] [place], [place] [address]    [facility] STAPHYLOCOCCUS LUGDUNENSIS 12/07/2021     Lab Results  Component Value Date   ALBUMIN 4.1 12/07/2021   ALBUMIN 4.2 10/04/2021   ALBUMIN 3.9 10/03/2021    Lab Results  Component Value Date   MG 1.7 10/03/2021   No results found for: "VD25OH"  No results found for: "PREALBUMIN"    Latest Ref Rng & Units 10/03/2021    4:55 PM  CBC EXTENDED  WBC 4.0 - 10.5 K/uL 6.4   RBC 3.87 - 5.11 MIL/uL 4.65   Hemoglobin 12.0 - 15.0 g/dL 13.2   HCT 44.0 - 10.2 % 44.7   Platelets 150 - 400 K/uL 264   NEUT# 1.7 - 7.7 K/uL 4.5   Lymph# 0.7 - 4.0 K/uL 1.2      There is no height or weight on file to calculate BMI.  Orders:  Orders Placed This Encounter  Procedures   XR Foot Complete Left   CBC with Differential   Comprehensive Metabolic Panel (CMET)   HgB A1c   No orders of the defined types were placed in this encounter.    Procedures: No procedures performed  Clinical Data: No additional findings.  ROS:  All other systems negative, except as noted in the  HPI. Review of Systems  Objective: Vital Signs: There were no vitals taken for this visit.  Specialty Comments:  No specialty comments available.  PMFS History: Patient Active Problem List   Diagnosis Date Noted   MDD (major depressive disorder), recurrent severe, without psychosis (HCC) 10/04/2021   Alcohol use disorder, severe, dependence (HCC) 10/04/2021   Cannabis abuse 10/04/2021   History reviewed. No pertinent past medical history.  History reviewed. No pertinent family history.  Past Surgical History:  Procedure Laterality Date   INCISION AND DRAINAGE ABSCESS Right 12/07/2021   Procedure: INCISION AND DRAINAGE OF RIGHT BREAST ABSCESS;  Surgeon: Manus Rudd, MD;  Location: [facility] OR;  Service: General;  Laterality: Right;    Social History   Occupational History   Not on file  Tobacco Use   Smoking status: Former   Smokeless tobacco: Never  Vaping Use   Vaping status: Every Day   Substances: Nicotine, Flavoring  Substance and Sexual Activity   Alcohol use: Yes    Comment: 1/5th liquor daily   Drug use: Yes    Types: Marijuana   Sexual activity: Not on file

## 2023-05-31 ENCOUNTER — Ambulatory Visit (HOSPITAL_BASED_OUTPATIENT_CLINIC_OR_DEPARTMENT_OTHER): Payer: PRIVATE HEALTH INSURANCE | Admitting: Anesthesiology

## 2023-05-31 ENCOUNTER — Encounter (HOSPITAL_COMMUNITY): Payer: Self-pay | Admitting: Orthopedic Surgery

## 2023-05-31 ENCOUNTER — Encounter (HOSPITAL_COMMUNITY)
Admission: RE | Disposition: A | Discharge status: Home / Self Care | Payer: Self-pay | Source: Home / Self Care | Attending: Orthopedic Surgery

## 2023-05-31 ENCOUNTER — Other Ambulatory Visit: Payer: Self-pay

## 2023-05-31 ENCOUNTER — Ambulatory Visit (HOSPITAL_COMMUNITY): Payer: PRIVATE HEALTH INSURANCE | Admitting: Anesthesiology

## 2023-05-31 ENCOUNTER — Observation Stay (HOSPITAL_COMMUNITY)
Admission: RE | Admit: 2023-05-31 | Discharge: 2023-06-02 | Disposition: A | Discharge status: Home / Self Care | Payer: PRIVATE HEALTH INSURANCE | Attending: Orthopedic Surgery | Admitting: Orthopedic Surgery

## 2023-05-31 DIAGNOSIS — L02612 Cutaneous abscess of left foot: Secondary | ICD-10-CM

## 2023-05-31 DIAGNOSIS — Z87891 Personal history of nicotine dependence: Secondary | ICD-10-CM | POA: Insufficient documentation

## 2023-05-31 DIAGNOSIS — L02619 Cutaneous abscess of unspecified foot: Principal | ICD-10-CM | POA: Diagnosis present

## 2023-05-31 HISTORY — PX: I & D EXTREMITY: SHX5045

## 2023-05-31 HISTORY — DX: Other specified health status: Z78.9

## 2023-05-31 LAB — POCT PREGNANCY, URINE: Preg Test, Ur: NEGATIVE

## 2023-05-31 LAB — AEROBIC/ANAEROBIC CULTURE W GRAM STAIN (SURGICAL/DEEP WOUND)

## 2023-05-31 SURGERY — IRRIGATION AND DEBRIDEMENT EXTREMITY
Anesthesia: General | Laterality: Left

## 2023-05-31 MED ORDER — MIDAZOLAM HCL 2 MG/2ML IJ SOLN
INTRAMUSCULAR | Status: DC | PRN
  Administered 2023-05-31: 2 mg via INTRAVENOUS

## 2023-05-31 MED ORDER — ACETAMINOPHEN 325 MG PO TABS: 325.0000 mg | ORAL_TABLET | Freq: Four times a day (QID) | ORAL | Status: DC | PRN

## 2023-05-31 MED ORDER — AMISULPRIDE (ANTIEMETIC) 5 MG/2ML IV SOLN: 10.0000 mg | Freq: Once | INTRAVENOUS | Status: DC | PRN

## 2023-05-31 MED ORDER — LIDOCAINE 2% (20 MG/ML) 5 ML SYRINGE
INTRAMUSCULAR | Status: DC | PRN
  Administered 2023-05-31: 60 mg via INTRAVENOUS

## 2023-05-31 MED ORDER — ONDANSETRON HCL 4 MG/2ML IJ SOLN: 4.0000 mg | Freq: Four times a day (QID) | INTRAMUSCULAR | Status: DC | PRN

## 2023-05-31 MED ORDER — METHOCARBAMOL 1000 MG/10ML IJ SOLN: 500.0000 mg | Freq: Four times a day (QID) | INTRAVENOUS | Status: DC | PRN

## 2023-05-31 MED ORDER — PROPOFOL 10 MG/ML IV BOLUS
INTRAVENOUS | Status: AC
  Filled 2023-05-31: qty 20

## 2023-05-31 MED ORDER — FENTANYL CITRATE (PF) 100 MCG/2ML IJ SOLN
25.0000 ug | INTRAMUSCULAR | Status: DC | PRN
  Administered 2023-05-31 (×2): 50 ug via INTRAVENOUS

## 2023-05-31 MED ORDER — MIDAZOLAM HCL 2 MG/2ML IJ SOLN
INTRAMUSCULAR | Status: AC
  Filled 2023-05-31: qty 2

## 2023-05-31 MED ORDER — ONDANSETRON HCL 4 MG/2ML IJ SOLN
INTRAMUSCULAR | Status: DC | PRN
  Administered 2023-05-31: 4 mg via INTRAVENOUS

## 2023-05-31 MED ORDER — METOCLOPRAMIDE HCL 5 MG PO TABS: 5.0000 mg | ORAL_TABLET | Freq: Three times a day (TID) | ORAL | Status: DC | PRN

## 2023-05-31 MED ORDER — PROMETHAZINE HCL 25 MG/ML IJ SOLN: 6.2500 mg | INTRAMUSCULAR | Status: DC | PRN

## 2023-05-31 MED ORDER — OXYCODONE HCL 5 MG PO TABS: 5.0000 mg | ORAL_TABLET | Freq: Once | ORAL | Status: AC | PRN

## 2023-05-31 MED ORDER — HYDROMORPHONE HCL 1 MG/ML IJ SOLN
0.5000 mg | INTRAMUSCULAR | Status: DC | PRN
  Administered 2023-06-01 (×2): 1 mg via INTRAVENOUS
  Filled 2023-05-31 (×2): qty 1

## 2023-05-31 MED ORDER — VASHE WOUND IRRIGATION OPTIME
TOPICAL | Status: DC | PRN
Start: 2023-05-31 — End: 2023-05-31
  Administered 2023-05-31: 34 [oz_av]

## 2023-05-31 MED ORDER — CEFAZOLIN SODIUM-DEXTROSE 1-4 GM/50ML-% IV SOLN
1.0000 g | Freq: Four times a day (QID) | INTRAVENOUS | Status: AC
  Administered 2023-05-31 – 2023-06-01 (×3): 1 g via INTRAVENOUS
  Filled 2023-05-31 (×3): qty 50

## 2023-05-31 MED ORDER — DEXAMETHASONE SODIUM PHOSPHATE 10 MG/ML IJ SOLN
INTRAMUSCULAR | Status: DC | PRN
  Administered 2023-05-31: 10 mg via INTRAVENOUS

## 2023-05-31 MED ORDER — OXYCODONE HCL 5 MG/5ML PO SOLN
5.0000 mg | Freq: Once | ORAL | Status: AC | PRN
  Administered 2023-05-31: 5 mg via ORAL

## 2023-05-31 MED ORDER — KETOROLAC TROMETHAMINE 30 MG/ML IJ SOLN
INTRAMUSCULAR | Status: AC
  Filled 2023-05-31: qty 1

## 2023-05-31 MED ORDER — SODIUM CHLORIDE 0.9 % IV SOLN: INTRAVENOUS | Status: DC

## 2023-05-31 MED ORDER — CHLORHEXIDINE GLUCONATE 0.12 % MT SOLN
15.0000 mL | Freq: Once | OROMUCOSAL | Status: AC
  Administered 2023-05-31: 15 mL via OROMUCOSAL
  Filled 2023-05-31: qty 15

## 2023-05-31 MED ORDER — FENTANYL CITRATE (PF) 100 MCG/2ML IJ SOLN
INTRAMUSCULAR | Status: AC
  Filled 2023-05-31: qty 2

## 2023-05-31 MED ORDER — ONDANSETRON HCL 4 MG PO TABS: 4.0000 mg | ORAL_TABLET | Freq: Four times a day (QID) | ORAL | Status: DC | PRN

## 2023-05-31 MED ORDER — BISACODYL 10 MG RE SUPP: 10.0000 mg | Freq: Every day | RECTAL | Status: DC | PRN

## 2023-05-31 MED ORDER — ORAL CARE MOUTH RINSE: 15.0000 mL | Freq: Once | OROMUCOSAL | Status: AC

## 2023-05-31 MED ORDER — OXYCODONE HCL 5 MG/5ML PO SOLN
ORAL | Status: AC
  Filled 2023-05-31: qty 5

## 2023-05-31 MED ORDER — DIPHENHYDRAMINE HCL 25 MG PO CAPS
25.0000 mg | ORAL_CAPSULE | Freq: Four times a day (QID) | ORAL | Status: DC | PRN
  Administered 2023-05-31 – 2023-06-01 (×2): 25 mg via ORAL
  Filled 2023-05-31 (×2): qty 1

## 2023-05-31 MED ORDER — PROPOFOL 10 MG/ML IV BOLUS
INTRAVENOUS | Status: DC | PRN
Start: 2023-05-31 — End: 2023-05-31
  Administered 2023-05-31: 200 mg via INTRAVENOUS

## 2023-05-31 MED ORDER — MAGNESIUM CITRATE PO SOLN: 1.0000 | Freq: Once | ORAL | Status: DC | PRN

## 2023-05-31 MED ORDER — OXYCODONE HCL 5 MG PO TABS
10.0000 mg | ORAL_TABLET | ORAL | Status: DC | PRN
  Administered 2023-06-01: 10 mg via ORAL
  Administered 2023-06-01: 15 mg via ORAL
  Administered 2023-06-01 – 2023-06-02 (×2): 10 mg via ORAL
  Filled 2023-05-31: qty 2
  Filled 2023-05-31: qty 3

## 2023-05-31 MED ORDER — 0.9 % SODIUM CHLORIDE (POUR BTL) OPTIME
TOPICAL | Status: DC | PRN
  Administered 2023-05-31: 1000 mL

## 2023-05-31 MED ORDER — ACETAMINOPHEN 10 MG/ML IV SOLN: 1000.0000 mg | Freq: Once | INTRAVENOUS | Status: DC | PRN

## 2023-05-31 MED ORDER — OXYCODONE HCL 5 MG PO TABS
5.0000 mg | ORAL_TABLET | ORAL | Status: DC | PRN
  Administered 2023-05-31: 5 mg via ORAL
  Administered 2023-05-31: 10 mg via ORAL
  Filled 2023-05-31: qty 1
  Filled 2023-05-31 (×3): qty 2

## 2023-05-31 MED ORDER — METHOCARBAMOL 500 MG PO TABS
500.0000 mg | ORAL_TABLET | Freq: Four times a day (QID) | ORAL | Status: DC | PRN
  Administered 2023-05-31 – 2023-06-02 (×5): 500 mg via ORAL
  Filled 2023-05-31 (×5): qty 1

## 2023-05-31 MED ORDER — DOCUSATE SODIUM 100 MG PO CAPS
100.0000 mg | ORAL_CAPSULE | Freq: Two times a day (BID) | ORAL | Status: DC
  Filled 2023-05-31 (×2): qty 1

## 2023-05-31 MED ORDER — ACETAMINOPHEN 10 MG/ML IV SOLN
INTRAVENOUS | Status: AC
  Administered 2023-05-31: 1000 mg
  Filled 2023-05-31: qty 100

## 2023-05-31 MED ORDER — FENTANYL CITRATE (PF) 250 MCG/5ML IJ SOLN
INTRAMUSCULAR | Status: DC | PRN
  Administered 2023-05-31: 50 ug via INTRAVENOUS
  Administered 2023-05-31: 100 ug via INTRAVENOUS
  Administered 2023-05-31 (×4): 25 ug via INTRAVENOUS

## 2023-05-31 MED ORDER — KETOROLAC TROMETHAMINE 30 MG/ML IJ SOLN
30.0000 mg | Freq: Once | INTRAMUSCULAR | Status: AC | PRN
  Administered 2023-05-31: 30 mg via INTRAVENOUS

## 2023-05-31 MED ORDER — FENTANYL CITRATE (PF) 250 MCG/5ML IJ SOLN
INTRAMUSCULAR | Status: AC
  Filled 2023-05-31: qty 5

## 2023-05-31 MED ORDER — METOCLOPRAMIDE HCL 5 MG/ML IJ SOLN: 5.0000 mg | Freq: Three times a day (TID) | INTRAMUSCULAR | Status: DC | PRN

## 2023-05-31 MED ORDER — CEFAZOLIN SODIUM-DEXTROSE 2-4 GM/100ML-% IV SOLN
2.0000 g | INTRAVENOUS | Status: AC
  Administered 2023-05-31: 2 g via INTRAVENOUS
  Filled 2023-05-31: qty 100

## 2023-05-31 MED ORDER — POLYETHYLENE GLYCOL 3350 17 G PO PACK: 17.0000 g | PACK | Freq: Every day | ORAL | Status: DC | PRN

## 2023-05-31 MED ORDER — LACTATED RINGERS IV SOLN: INTRAVENOUS | Status: DC

## 2023-05-31 SURGICAL SUPPLY — 27 items
BLADE SURG 21 STRL SS (BLADE) ×1 IMPLANT
BNDG COHESIVE 6X5 TAN NS LF (GAUZE/BANDAGES/DRESSINGS) IMPLANT
BNDG GAUZE DERMACEA FLUFF 4 (GAUZE/BANDAGES/DRESSINGS) ×2 IMPLANT
BNDG GZE DERMACEA 4 6PLY (GAUZE/BANDAGES/DRESSINGS) ×2
COVER SURGICAL LIGHT HANDLE (MISCELLANEOUS) ×2 IMPLANT
DRAPE U-SHAPE 47X51 STRL (DRAPES) ×1 IMPLANT
DRSG ADAPTIC 3X8 NADH LF (GAUZE/BANDAGES/DRESSINGS) ×1 IMPLANT
DRSG TUBE GAUZE 1X5YD SZ2 (GAUZE/BANDAGES/DRESSINGS) IMPLANT
DURAPREP 26ML APPLICATOR (WOUND CARE) ×1 IMPLANT
GAUZE PAD ABD 8X10 STRL (GAUZE/BANDAGES/DRESSINGS) IMPLANT
GAUZE SPONGE 4X4 12PLY STRL (GAUZE/BANDAGES/DRESSINGS) ×1 IMPLANT
GLOVE BIOGEL PI IND STRL 9 (GLOVE) ×1 IMPLANT
GLOVE SURG ORTHO 9.0 STRL STRW (GLOVE) ×1 IMPLANT
GOWN STRL REUS W/ TWL XL LVL3 (GOWN DISPOSABLE) ×2 IMPLANT
GOWN STRL REUS W/TWL XL LVL3 (GOWN DISPOSABLE) ×2
GRAFT SKIN WND MICRO 38 (Tissue) IMPLANT
KIT BASIN OR (CUSTOM PROCEDURE TRAY) ×1 IMPLANT
KIT TURNOVER KIT B (KITS) ×1 IMPLANT
MANIFOLD NEPTUNE II (INSTRUMENTS) ×1 IMPLANT
NS IRRIG 1000ML POUR BTL (IV SOLUTION) ×1 IMPLANT
PACK ORTHO EXTREMITY (CUSTOM PROCEDURE TRAY) ×1 IMPLANT
PAD ARMBOARD 7.5X6 YLW CONV (MISCELLANEOUS) ×2 IMPLANT
SUT ETHILON 2 0 PSLX (SUTURE) ×1 IMPLANT
SWAB COLLECTION DEVICE MRSA (MISCELLANEOUS) ×1 IMPLANT
TOWEL GREEN STERILE (TOWEL DISPOSABLE) ×1 IMPLANT
TUBE CONNECTING 12X1/4 (SUCTIONS) ×1 IMPLANT
YANKAUER SUCT BULB TIP NO VENT (SUCTIONS) ×1 IMPLANT

## 2023-05-31 NOTE — Transfer of Care (Signed)
Immediate Anesthesia Transfer of Care Note  Patient: Ashley Bautista  Procedure(s) Performed: LEFT FOOT DEBRIDEMENT (Left)  Patient Location: PACU  Anesthesia Type:General  Level of Consciousness: awake, alert , and oriented  Airway & Oxygen Therapy: Patient Spontanous Breathing  Post-op Assessment: Report given to RN, Post -op Vital signs reviewed and stable, and Patient moving all extremities X 4  Post vital signs: Reviewed and stable  Last Vitals:  Vitals Value Taken Time  BP 169/107 05/31/23 1218  Temp    Pulse 82 05/31/23 1220  Resp 11 05/31/23 1220  SpO2 97 % 05/31/23 1220  Vitals shown include unfiled device data.  Last Pain:  Vitals:   05/31/23 1023  PainSc: 7          Complications: No notable events documented.

## 2023-05-31 NOTE — Anesthesia Procedure Notes (Signed)
Procedure Name: LMA Insertion Date/Time: 05/31/2023 11:44 AM  Performed by: Alease Medina, CRNAPre-anesthesia Checklist: Patient identified, Emergency Drugs available, Suction available and Patient being monitored Patient Re-evaluated:Patient Re-evaluated prior to induction Oxygen Delivery Method: Circle system utilized Preoxygenation: Pre-oxygenation with 100% oxygen Induction Type: IV induction Ventilation: Mask ventilation without difficulty LMA: LMA inserted LMA Size: 4.0 Number of attempts: 1 Placement Confirmation: positive ETCO2, breath sounds checked- equal and bilateral and CO2 detector Tube secured with: Tape Dental Injury: Teeth and Oropharynx as per pre-operative assessment

## 2023-05-31 NOTE — Op Note (Signed)
05/31/2023  12:04 PM  PATIENT:  Ashley Bautista    PRE-OPERATIVE DIAGNOSIS:  Abscess Left Foot  POST-OPERATIVE DIAGNOSIS:  Same  PROCEDURE:  LEFT FOOT EXCISIONAL DEBRIDEMENT of skin and soft tissue muscle fascia and tendon. Tissue sent for cultures. Application of Kerecis micro graft 38 cm. Local tissue rearrangement for wound closure 3 x 10 cm. Wound irrigation with Vashe.   SURGEON:  [name], MD  PHYSICIAN ASSISTANT:None ANESTHESIA:   General  PREOPERATIVE INDICATIONS:  [name] is a  37 y.o. female with a diagnosis of Abscess Left Foot who failed conservative measures and elected for surgical management.    The risks benefits and alternatives were discussed with the patient preoperatively including but not limited to the risks of infection, bleeding, nerve injury, cardiopulmonary complications, the need for revision surgery, among others, and the patient was willing to proceed.  OPERATIVE IMPLANTS:   Implant Name Type Inv. Item Serial No. Manufacturer Lot No. LRB No. Used Action  GRAFT SKIN WND MICRO 38 - [id number] Tissue GRAFT SKIN WND MICRO 38  [employer] [id number] Left 1 Implanted    @ENCIMAGES @  OPERATIVE FINDINGS: Patient had a large abscess in the first webspace.  Tissue margins were clear at time of closure the wound was irrigated with Vashe.  OPERATIVE PROCEDURE: Patient brought the operating room and underwent general anesthetic.  After adequate levels anesthesia were obtained patient's left lower extremity was prepped using DuraPrep draped into a sterile field a timeout was called.  A V incision was made around the ulcerative necrotic tissue and the necrotic abscess tissue was sent for cultures.  This left a wound that was 3 x 10 cm.  A rondure was used to further debride back the soft tissue to healthy viable margins.  Skin and soft tissue muscle fascia and tendon was excised..  The wound edges were grossly clear electrocautery was used hemostasis.   The wound was irrigated with Vashe.  The wound was filled with 38 cm of Kerecis micro graft.  The tissue edges were undermined and local tissue rearrangement was used to close the wound 10 x 3 cm with 2-0 nylon.  The wound was covered with a dry dressing patient was taken the PACU in stable condition.   DISCHARGE PLANNING:  Antibiotic duration: Continue antibiotics for 24 hours anticipate discharging on oral antibiotics  Weightbearing: Touchdown weightbearing on the left  Pain medication: Opioid pathway  Dressing care/ Wound VAC: Dry dressing  Ambulatory devices: Walker or crutches  Discharge to: Anticipate discharge to home  Follow-up: In the office 1 week post operative.

## 2023-05-31 NOTE — Interval H&P Note (Signed)
History and Physical Interval Note:  05/31/2023 10:15 AM  [name] Falling  has presented today for surgery, with the diagnosis of Abscess Left Foot.  The various methods of treatment have been discussed with the patient and family. After consideration of risks, benefits and other options for treatment, the patient has consented to  Procedure(s): LEFT FOOT DEBRIDEMENT (Left) as a surgical intervention.  The patient's history has been reviewed, patient examined, no change in status, stable for surgery.  I have reviewed the patient's chart and labs.  Questions were answered to the patient's satisfaction.     Ashley Bautista

## 2023-05-31 NOTE — Anesthesia Preprocedure Evaluation (Addendum)
Anesthesia Evaluation  Patient identified by MRN, date of birth, ID band Patient awake    Reviewed: Allergy & Precautions, NPO status , Patient's Chart, lab work & pertinent test results  Airway Mallampati: I  TM Distance: >3 FB Neck ROM: Full    Dental no notable dental hx.    Pulmonary Patient abstained from smoking., former smoker   Pulmonary exam normal        Cardiovascular negative cardio ROS Normal cardiovascular exam     Neuro/Psych  PSYCHIATRIC DISORDERS  Depression    negative neurological ROS     GI/Hepatic negative GI ROS,,,(+)     substance abuse    Endo/Other  negative endocrine ROS    Renal/GU negative Renal ROS     Musculoskeletal negative musculoskeletal ROS (+)    Abdominal   Peds  Hematology negative hematology ROS (+)   Anesthesia Other Findings Abscess Left Foot  Reproductive/Obstetrics Hcg negative                             Anesthesia Physical Anesthesia Plan  ASA: 2  Anesthesia Plan: General   Post-op Pain Management:    Induction: Intravenous  PONV Risk Score and Plan: 3 and Ondansetron, Dexamethasone, Midazolam and Treatment may vary due to age or medical condition  Airway Management Planned: LMA  Additional Equipment:   Intra-op Plan:   Post-operative Plan: Extubation in OR  Informed Consent: I have reviewed the patients History and Physical, chart, labs and discussed the procedure including the risks, benefits and alternatives for the proposed anesthesia with the patient or authorized representative who has indicated his/her understanding and acceptance.     Dental advisory given  Plan Discussed with: CRNA  Anesthesia Plan Comments:        Anesthesia Quick Evaluation

## 2023-05-31 NOTE — Anesthesia Postprocedure Evaluation (Signed)
Anesthesia Post Note  Patient: Ashley Bautista  Procedure(s) Performed: LEFT FOOT DEBRIDEMENT (Left)     Patient location during evaluation: PACU Anesthesia Type: General Level of consciousness: awake Pain management: pain level controlled Vital Signs Assessment: post-procedure vital signs reviewed and stable Respiratory status: spontaneous breathing, nonlabored ventilation and respiratory function stable Cardiovascular status: blood pressure returned to baseline and stable Postop Assessment: no apparent nausea or vomiting Anesthetic complications: no   No notable events documented.  Last Vitals:  Vitals:   05/31/23 1415 05/31/23 1430  BP: (!) 153/95 (!) 131/94  Pulse: (!) 52 99  Resp: 11 14  Temp:    SpO2: 99% 99%    Last Pain:  Vitals:   05/31/23 1345  PainSc: 7                  Jennine Peddy P Doreatha Offer

## 2023-05-31 NOTE — H&P (Signed)
Ashley Bautista is an 37 y.o. female.   Chief Complaint: Abscess first webspace left foot HPI: Patient is a 37 year old woman who was seen for initial evaluation and referral from Ashley Bautista. Patient states she first noticed pain and swelling when she stubbed her toe 6 days ago. Patient was provided a prescription for tramadol radiographs showed no fracture. Patient presents with increasing redness swelling and purulent drainage.   Past Medical History:  Diagnosis Date   Medical history non-contributory     Past Surgical History:  Procedure Laterality Date   INCISION AND DRAINAGE ABSCESS Right 12/07/2021   Procedure: INCISION AND DRAINAGE OF RIGHT BREAST ABSCESS;  Surgeon: Manus Rudd, MD;  Location: [facility] OR;  Service: General;  Laterality: Right;    History reviewed. No pertinent family history. Social History:  reports that she has quit smoking. She has never used smokeless tobacco. She reports current alcohol use. She reports current drug use. Drug: Marijuana.  Allergies: No Known Allergies  No medications prior to admission.    Results for orders placed or performed in visit on 05/30/23 (from the past 48 hour(s))  CBC with Differential     Status: None   Collection Time: 05/30/23 10:43 AM  Result Value Ref Range   WBC 9.5 3.8 - 10.8 Thousand/uL   RBC 3.91 3.80 - 5.10 Million/uL   Hemoglobin 12.4 11.7 - 15.5 g/dL   HCT 96.2 95.2 - 84.1 %   MCV 96.9 80.0 - 100.0 fL   MCH 31.7 27.0 - 33.0 pg   MCHC 32.7 32.0 - 36.0 g/dL   RDW 32.4 40.1 - 02.7 %   Platelets 252 140 - 400 Thousand/uL   MPV 11.2 7.5 - 12.5 fL   Neutro Abs 7,743 1,500 - 7,800 cells/uL   Lymphs Abs 960 850 - 3,900 cells/uL   Absolute Monocytes 732 200 - 950 cells/uL   Eosinophils Absolute 48 15 - 500 cells/uL   Basophils Absolute 19 0 - 200 cells/uL   Neutrophils Relative % 81.5 %   Total Lymphocyte 10.1 %   Monocytes Relative 7.7 %   Eosinophils Relative 0.5 %   Basophils Relative 0.2 %   Comprehensive Metabolic Panel (CMET)     Status: None   Collection Time: 05/30/23 10:43 AM  Result Value Ref Range   Glucose, Bld 72 65 - 99 mg/dL    Comment: .            Fasting reference interval .    BUN 8 7 - 25 mg/dL   Creat 2.53 6.64 - 4.03 mg/dL   BUN/Creatinine Ratio SEE NOTE: 6 - 22 (calc)    Comment:    Not Reported: BUN and Creatinine are within    reference range. .    Sodium 138 135 - 146 mmol/L   Potassium 3.9 3.5 - 5.3 mmol/L   Chloride 99 98 - 110 mmol/L   CO2 28 20 - 32 mmol/L   Calcium 9.6 8.6 - 10.2 mg/dL   Total Protein 7.2 6.1 - 8.1 g/dL   Albumin 4.3 3.6 - 5.1 g/dL   Globulin 2.9 1.9 - 3.7 g/dL (calc)   AG Ratio 1.5 1.0 - 2.5 (calc)   Total Bilirubin 0.7 0.2 - 1.2 mg/dL   Alkaline phosphatase (APISO) 49 31 - 125 U/L   AST 21 10 - 30 U/L   ALT 12 6 - 29 U/L  HgB A1c     Status: None   Collection Time: 05/30/23 10:43 AM  Result Value  Ref Range   Hgb A1c MFr Bld 4.9 <5.7 % of total Hgb    Comment: For the purpose of screening for the presence of diabetes: . <5.7%       Consistent with the absence of diabetes 5.7-6.4%    Consistent with increased risk for diabetes             (prediabetes) > or =6.5%  Consistent with diabetes . This assay result is consistent with a decreased risk of diabetes. . Currently, no consensus exists regarding use of hemoglobin A1c for diagnosis of diabetes in children. . According to American Diabetes Association (ADA) guidelines, hemoglobin A1c <7.0% represents optimal control in non-pregnant diabetic patients. Different metrics may apply to specific patient populations.  Standards of Medical Care in Diabetes(ADA). .    Mean Plasma Glucose 94 mg/dL   eAG (mmol/L) 5.2 mmol/L    Comment: . This test was performed on the Roche cobas c503 platform. Effective 08/06/22, a change in test platforms from the Abbott Architect to the Roche cobas c503 may have shifted HbA1c results compared to historical results. Based on  laboratory validation testing conducted at [facility], the Roche platform relative to the Abbott platform had an average increase in HbA1c value of < or = 0.3%. This difference is within accepted  variability established by the [facility]. Note that not all individuals will have had a shift in their results and direct comparisons between historical and current results for testing conducted on different platforms is not recommended.    No results found.  Review of Systems  All other systems reviewed and are negative.   Height 5\' 6"  (1.676 m), weight 63.5 kg, last menstrual period 05/12/2023. Physical Exam  Patient is alert, oriented, no adenopathy, well-dressed, normal affect, normal respiratory effort. Examination patient has a strong dorsalis pedis pulse there is a draining abscess in the first webspace with purulent drainage there is ascending cellulitis to the midfoot.  No recent laboratory studies we will obtain a CBC c-Met and hemoglobin A1c.  Calf is soft nontender no evidence of any ascending fasciitis. Assessment/Plan 1. Pain in left foot   2. Cutaneous abscess of left foot       Plan: Will plan for surgical debridement of the abscess first webspace.  Discussed with the patient we will admit her postoperatively for IV antibiotics and may need to proceed with additional surgery based on extent of the abscess.  Smoking cessation was discussed.  [name], MD 05/31/2023, 7:01 AM

## 2023-06-01 DIAGNOSIS — L02612 Cutaneous abscess of left foot: Secondary | ICD-10-CM | POA: Diagnosis not present

## 2023-06-01 LAB — GLUCOSE, CAPILLARY: Glucose-Capillary: 78 mg/dL (ref 70–99)

## 2023-06-01 MED ORDER — VANCOMYCIN HCL 1250 MG/250ML IV SOLN
1250.0000 mg | Freq: Two times a day (BID) | INTRAVENOUS | Status: DC
  Administered 2023-06-01 – 2023-06-02 (×2): 1250 mg via INTRAVENOUS
  Filled 2023-06-01 (×2): qty 250

## 2023-06-01 NOTE — Plan of Care (Signed)

## 2023-06-01 NOTE — Progress Notes (Addendum)
Pharmacy Antibiotic Note  Ashley Bautista is a 37 y.o. female admitted on 05/31/2023 with abscess of L foot. Pharmacy has been consulted for Vancomycin (originally cefepime) dosing.  Wound culture obtained with S. Aureus, pending susceptibilities. Discussed with Dr. Lajoyce Corners, will switch to vancomycin monotherapy pending susceptibilities.  Plan: Vancomycin 1250 IV every 12 hours. Goal AUC 400-550. eAUC of 521.5 w/ Scr 0.59 Monitor clinical response and SCr daily  Height: 5\' 6"  (167.6 cm) Weight: 63.5 kg (140 lb) IBW/kg (Calculated) : 59.3  Temp (24hrs), Avg:98.3 F (36.8 C), Min:98.1 F (36.7 C), Max:99 F (37.2 C)  Recent Labs  Lab 05/30/23 1043  WBC 9.5  CREATININE 0.59    Estimated Creatinine Clearance: 89.3 mL/min (by C-G formula based on SCr of 0.59 mg/dL).    No Known Allergies  Antimicrobials this admission: Cefazolin 8/2 >> 8/3  Microbiology results: 8/3 Wound Culture: Moderate Staph aureus. Abundant WBCs.  Thank you for allowing pharmacy to be a part of this patient's care.  Lora Paula, PharmD PGY-2 Infectious Diseases Pharmacy Resident 06/01/2023 1:47 PM

## 2023-06-01 NOTE — Plan of Care (Signed)
  Problem: Education: Goal: Knowledge of General Education information will improve Description: Including pain rating scale, medication(s)/side effects and non-pharmacologic comfort measures Outcome: Progressing   Problem: Clinical Measurements: Goal: Ability to maintain clinical measurements within normal limits will improve Outcome: Progressing Goal: Will remain free from infection Outcome: Progressing   Problem: Pain Managment: Goal: General experience of comfort will improve Outcome: Progressing   

## 2023-06-01 NOTE — Evaluation (Signed)
Physical Therapy Evaluation Patient Details Name: Ashley Bautista MRN: [id number] DOB: [birth year] Today's Date: 06/01/2023  History of Present Illness  Pt is 37 yo female presenting for surgical debridement of abscess of the L foot in the great toe webspace on 05/31/23. TDWB LLE. PMH: MDD, alcohol dependence  Clinical Impression  Pt is below baseline due to L foot injury and recent excisional debridement with TDW restrictions for WB. Pt is Mod I for all functional activity with crutches and CGA for stairs. Due to pt current functional status, home set up and assistance at home recommending no continued skilled physical therapy services at this time. Please re-consult if pt has further needs. Pt will be discharged at this time.         Equipment Recommendations None recommended by PT  Recommendations for Other Services       Functional Status Assessment Patient has not had a recent decline in their functional status     Precautions / Restrictions Precautions Precautions: Fall Restrictions Weight Bearing Restrictions: Yes LLE Weight Bearing: Touchdown weight bearing      Mobility  Bed Mobility Overal bed mobility: Modified Independent               Patient Response: Cooperative  Transfers Overall transfer level: Modified independent Equipment used: Crutches               General transfer comment: Able to maintain NWB    Ambulation/Gait Ambulation/Gait assistance: Modified independent (Device/Increase time)   Assistive device: Crutches Gait Pattern/deviations: Step-to pattern Gait velocity: Slightly decreased Gait velocity interpretation: 1.31 - 2.62 ft/sec, indicative of limited community ambulator   General Gait Details: Able to maintain NWB, 2 point contact pattern with bil crutches  Stairs Stairs: Yes Stairs assistance: Min guard Stair Management: One rail Right, Step to pattern, Forwards, With crutches Number of Stairs: 3 General stair comments: Pt has  been navigating stairs per home set up at [facility]  Wheelchair Mobility     Tilt Bed Tilt Bed Patient Response: Cooperative       Balance Overall balance assessment: Mild deficits observed, not formally tested         Pertinent Vitals/Pain Pain Assessment Pain Assessment: 0-10 Pain Score: 9  Pain Location: L foot Pain Descriptors / Indicators: Burning, Throbbing Pain Intervention(s): Monitored during session, Patient requesting pain meds-RN notified, Limited activity within patient's tolerance    Home Living Family/patient expects to be discharged to:: Private residence Living Arrangements: Alone   Type of Home: Apartment Home Access: Stairs to enter Entrance Stairs-Rails: Doctor, general practice of Steps: 14 stairs   Home Layout: One level Home Equipment: Crutches      Prior Function Prior Level of Function : Independent/Modified Independent;Working/employed;Driving             Mobility Comments: Before surgery pt was getting around with crutches including navigating stairs at home. Ind prior to injury ADLs Comments: Mod I prior to surgery, ind prior to injury     Hand Dominance   Dominant Hand: Right    Extremity/Trunk Assessment   Upper Extremity Assessment Upper Extremity Assessment: Overall WFL for tasks assessed    Lower Extremity Assessment Lower Extremity Assessment: Overall WFL for tasks assessed    Cervical / Trunk Assessment Cervical / Trunk Assessment: Normal  Communication   Communication: No difficulties  Cognition Arousal/Alertness: Awake/alert Behavior During Therapy: WFL for tasks assessed/performed Overall Cognitive Status: Within Functional Limits for tasks assessed      General Comments General comments (skin  integrity, edema, etc.): Dressing clean, dry and intact        Assessment/Plan    PT Assessment Patient does not need any further PT services         PT Goals (Current goals can be found in the Care  Plan section)  Acute Rehab PT Goals Patient Stated Goal: Stay with girlfriend and get back to work PT Goal Formulation: With patient Time For Goal Achievement: 06/15/23 Potential to Achieve Goals: Good     AM-PAC PT "6 Clicks" Mobility  Outcome Measure Help needed turning from your back to your side while in a flat bed without using bedrails?: None Help needed moving from lying on your back to sitting on the side of a flat bed without using bedrails?: None Help needed moving to and from a bed to a chair (including a wheelchair)?: None Help needed standing up from a chair using your arms (e.g., wheelchair or bedside chair)?: None Help needed to walk in hospital room?: None Help needed climbing 3-5 steps with a railing? : A Little 6 Click Score: 23    End of Session Equipment Utilized During Treatment: Gait belt Activity Tolerance: Patient tolerated treatment well Patient left: in bed;with call bell/phone within reach Nurse Communication: Mobility status      Time: 0981-1914 PT Time Calculation (min) (ACUTE ONLY): 31 min   Charges:   PT Evaluation $PT Eval Low Complexity: 1 Low PT Treatments $Therapeutic Activity: 8-22 mins PT General Charges $$ ACUTE PT VISIT: 1 Visit         Harrel Carina, DPT, CLT  Acute Rehabilitation Services Office: [phone] (Secure chat preferred)   Claudia Desanctis 06/01/2023, 11:20 AM

## 2023-06-01 NOTE — Progress Notes (Signed)
Patient ID: Ashley Bautista, female   DOB: [birth year], 37 y.o.   MRN: [id number] Patient is postoperative day 1 debridement abscess first webspace.  Cultures are pending.  Plan for Maxipen until cultures are finalized.  Patient did have necrotic fascia and fluid no gas in the soft tissue.  There was necrotic skin.

## 2023-06-02 ENCOUNTER — Encounter (HOSPITAL_COMMUNITY): Payer: Self-pay | Admitting: Orthopedic Surgery

## 2023-06-02 DIAGNOSIS — L02612 Cutaneous abscess of left foot: Secondary | ICD-10-CM | POA: Diagnosis not present

## 2023-06-02 LAB — CREATININE, SERUM
Creatinine, Ser: 0.65 mg/dL (ref 0.44–1.00)
GFR, Estimated: >60 mL/min (ref >60)

## 2023-06-02 MED ORDER — VANCOMYCIN HCL IN DEXTROSE 1-5 GM/200ML-% IV SOLN: 1000.0000 mg | Freq: Two times a day (BID) | INTRAVENOUS | Status: DC

## 2023-06-02 MED ORDER — OXYCODONE HCL 5 MG PO TABS: 5.0000 mg | ORAL_TABLET | Freq: Four times a day (QID) | ORAL | 0 refills | Status: AC | PRN

## 2023-06-02 MED ORDER — ASPIRIN 81 MG PO CHEW: 81.0000 mg | CHEWABLE_TABLET | Freq: Every day | ORAL | 0 refills | Status: AC

## 2023-06-02 MED ORDER — CEFAZOLIN SODIUM-DEXTROSE 2-4 GM/100ML-% IV SOLN
2.0000 g | Freq: Three times a day (TID) | INTRAVENOUS | Status: DC
  Administered 2023-06-02: 2 g via INTRAVENOUS
  Filled 2023-06-02: qty 100

## 2023-06-02 MED ORDER — IBUPROFEN 800 MG PO TABS: 800.0000 mg | ORAL_TABLET | Freq: Three times a day (TID) | ORAL | 0 refills | Status: AC | PRN

## 2023-06-02 MED ORDER — CEFADROXIL 500 MG PO CAPS: 500.0000 mg | ORAL_CAPSULE | Freq: Two times a day (BID) | ORAL | 0 refills | Status: AC

## 2023-06-02 NOTE — Discharge Instructions (Signed)
Okay for toe-touch weightbearing in the postoperative shoe.  Keep the surgical site dry and keep the dressing intact.  Follow-up with Dr Lajoyce Corners 1 week after your surgical date. Call the office at [phone] to schedule follow-up visit or if you have questions/concerns. Take antibiotics as prescribed.  Take aspirin 81mg  daily to prevent blood clots

## 2023-06-02 NOTE — Progress Notes (Addendum)
Pharmacy Antibiotic Note  Ashley Bautista is a 37 y.o. female admitted on 05/31/2023 with abscess of L foot. Pharmacy has been consulted for Vancomycin (originally cefepime) dosing.  Wound culture obtained with S. Aureus, pending susceptibilities. Discussed with Dr. Lajoyce Corners, will switch to vancomycin monotherapy pending susceptibilities.  8/4 AM: Adjusting dose today based on correction to CrCl estimation utilizing SCr of 0.8. Will monitor SCr today, as well as susceptibilities of S aureus isolate.  8/4 PM Addendum: Wound culture resulted with MSSA. Discussed with Harriette Bouillon, PA-C. Will switch to cefazolin, tentative plan for cefadroxil at discharge pending Dr. Lajoyce Corners.  Plan: Stop Vancomycin Start Cefazolin 2 g IV q8h Monitor clinical response and susceptibilities  Height: 5\' 6"  (167.6 cm) Weight: 63.5 kg (140 lb) IBW/kg (Calculated) : 59.3  Temp (24hrs), Avg:98.8 F (37.1 C), Min:98.6 F (37 C), Max:99 F (37.2 C)  Recent Labs  Lab 05/30/23 1043 06/02/23 0823  WBC 9.5  --   CREATININE 0.59 0.65    Estimated Creatinine Clearance: 89.3 mL/min (by C-G formula based on SCr of 0.65 mg/dL).    No Known Allergies  Antimicrobials this admission: Cefazolin 8/2 > 8/3 Vancomycin 8/3 >>  Microbiology results: 8/3 Wound Culture: Moderate MSSA. Abundant WBCs.  Thank you for allowing pharmacy to be a part of this patient's care.  Lora Paula, PharmD PGY-2 Infectious Diseases Pharmacy Resident 06/02/2023 2:04 PM

## 2023-06-02 NOTE — Progress Notes (Signed)
Patient is POD 2 debridement of left foot abscess in the first webspace.  Cultures are growing staph aureus and sensitivities are not back yet.  Currently on vancomycin.  She denies any vaginal complaints.  Pain is well-controlled.  No fevers or chills.  No chest pain/shortness of breath/calf pain.  On exam, soft dressing in place with no active bleeding.  She has no calf tenderness.  Negative Homans' sign.  Intact ankle dorsiflexion plantarflexion.  Plan is discharge back home after finalization of cultures and determination of appropriate antibiotics for discharge.

## 2023-06-03 NOTE — Discharge Summary (Signed)
Discharge Diagnoses:  Principal Problem:   Abscess of foot Active Problems:   Cutaneous abscess of left foot   Surgeries: Procedure(s): LEFT FOOT DEBRIDEMENT on 05/31/2023    Consultants:   Discharged Condition: Improved  Hospital Course: Ashley Bautista is an 37 y.o. female who was admitted 05/31/2023 with a chief complaint of abscess left foot, with a final diagnosis of Abscess Left Foot.  Patient was brought to the operating room on 05/31/2023 and underwent Procedure(s): LEFT FOOT DEBRIDEMENT.    Patient was given perioperative antibiotics:  Anti-infectives (From admission, onward)    Start     Dose/Rate Route Frequency Ordered Stop   06/02/23 1500  vancomycin (VANCOCIN) IVPB 1000 mg/200 mL premix  Status:  Discontinued        1,000 mg 200 mL/hr over 60 Minutes Intravenous Every 12 hours 06/02/23 0801 06/02/23 1401   06/02/23 1500  ceFAZolin (ANCEF) IVPB 2g/100 mL premix  Status:  Discontinued        2 g 200 mL/hr over 30 Minutes Intravenous Every 8 hours 06/02/23 1401 06/02/23 2218   06/02/23 0000  cefadroxil (DURICEF) 500 MG capsule        500 mg Oral 2 times daily 06/02/23 1512     06/01/23 1430  vancomycin (VANCOREADY) IVPB 1250 mg/250 mL  Status:  Discontinued        1,250 mg 166.7 mL/hr over 90 Minutes Intravenous Every 12 hours 06/01/23 1344 06/02/23 0801   05/31/23 1600  ceFAZolin (ANCEF) IVPB 1 g/50 mL premix        1 g 100 mL/hr over 30 Minutes Intravenous Every 6 hours 05/31/23 1500 06/01/23 0415   05/31/23 1015  ceFAZolin (ANCEF) IVPB 2g/100 mL premix        2 g 200 mL/hr over 30 Minutes Intravenous On call to O.R. 05/31/23 1010 05/31/23 1140     .  Patient was given sequential compression devices, early ambulation, and aspirin for DVT prophylaxis.  Recent vital signs: Patient Vitals for the past 24 hrs:  BP Temp Pulse Resp SpO2  06/02/23 1352 (!) 120/98 98.7 F (37.1 C) 70 18 98 %  .  Recent laboratory studies: No results found.  Discharge Medications:    Allergies as of 06/02/2023   No Known Allergies      Medication List     STOP taking these medications    traMADol 50 MG tablet Commonly known as: ULTRAM       TAKE these medications    aspirin 81 MG chewable tablet Commonly known as: Aspirin Childrens Chew 1 tablet (81 mg total) by mouth daily. To prevent blood clots   cefadroxil 500 MG capsule Commonly known as: DURICEF Take 1 capsule (500 mg total) by mouth 2 (two) times daily.   ibuprofen 800 MG tablet Commonly known as: ADVIL Take 1 tablet (800 mg total) by mouth every 8 (eight) hours as needed. What changed:  when to take this reasons to take this   oxyCODONE 5 MG immediate release tablet Commonly known as: Oxy IR/ROXICODONE Take 1 tablet (5 mg total) by mouth every 6 (six) hours as needed for moderate pain (pain score 4-6).        Diagnostic Studies: DG Foot Complete Left  Result Date: 05/25/2023 CLINICAL DATA:  Swelling and pain in left great toe after striking it on a table leg EXAM: LEFT FOOT - COMPLETE 3+ VIEW COMPARISON:  None Available. FINDINGS: There is no evidence of fracture or dislocation. There is no evidence of arthropathy  or other focal bone abnormality. Soft tissues irregularity about the medial great toe. IMPRESSION: No acute fracture or dislocation. Electronically Signed   By: Minerva Fester M.D.   On: 05/25/2023 23:30    Patient benefited maximally from their hospital stay and there were no complications.     Disposition: Discharge disposition: 01-Home or Self Care      Discharge Instructions     Call MD / Call 911   Complete by: As directed    If you experience chest pain or shortness of breath, CALL 911 and be transported to the hospital emergency room.  If you develope a fever above 101 F, pus (white drainage) or increased drainage or redness at the wound, or calf pain, call your surgeon's office.   Constipation Prevention   Complete by: As directed    Drink plenty of fluids.   Prune juice may be helpful.  You may use a stool softener, such as Colace (over the counter) 100 mg twice a day.  Use MiraLax (over the counter) for constipation as needed.   Diet - low sodium heart healthy   Complete by: As directed    Increase activity slowly as tolerated   Complete by: As directed    Post-operative opioid taper instructions:   Complete by: As directed    POST-OPERATIVE OPIOID TAPER INSTRUCTIONS: It is important to wean off of your opioid medication as soon as possible. If you do not need pain medication after your surgery it is ok to stop day one. Opioids include: Codeine, Hydrocodone(Norco, Vicodin), Oxycodone(Percocet, oxycontin) and hydromorphone amongst others.  Long term and even short term use of opiods can cause: Increased pain response Dependence Constipation Depression Respiratory depression And more.  Withdrawal symptoms can include Flu like symptoms Nausea, vomiting And more Techniques to manage these symptoms Hydrate well Eat regular healthy meals Stay active Use relaxation techniques(deep breathing, meditating, yoga) Do Not substitute Alcohol to help with tapering If you have been on opioids for less than two weeks and do not have pain than it is ok to stop all together.  Plan to wean off of opioids This plan should start within one week post op of your joint replacement. Maintain the same interval or time between taking each dose and first decrease the dose.  Cut the total daily intake of opioids by one tablet each day Next start to increase the time between doses. The last dose that should be eliminated is the evening dose.          Follow-up Information     [name], MD Follow up in 1 week(s).   Specialty: Orthopedic Surgery Contact information: [address] [place] [address] [phone]                  Signed: [name] 06/03/2023, 11:16 AM

## 2023-06-06 ENCOUNTER — Ambulatory Visit (INDEPENDENT_AMBULATORY_CARE_PROVIDER_SITE_OTHER): Payer: PRIVATE HEALTH INSURANCE | Admitting: Orthopedic Surgery

## 2023-06-06 DIAGNOSIS — M79672 Pain in left foot: Secondary | ICD-10-CM

## 2023-06-06 DIAGNOSIS — L02612 Cutaneous abscess of left foot: Secondary | ICD-10-CM

## 2023-06-10 ENCOUNTER — Encounter: Payer: Self-pay | Admitting: Orthopedic Surgery

## 2023-06-10 NOTE — Progress Notes (Signed)
Office Visit Note   Patient: Ashley Bautista           Date of Birth: 1986/09/21           MRN: [id number] Visit Date: 06/06/2023              Requested by: No referring provider defined for this encounter. PCP: Pcp, No  Chief Complaint  Patient presents with   Left Foot - Routine Post Op    05/31/2023 left foot debridement       HPI: Patient is a 37 year old woman who is 1 week status post debridement left abscess.  She has been nonweightbearing with crutches and a postoperative shoe.  Assessment & Plan: Visit Diagnoses:  1. Pain in left foot   2. Cutaneous abscess of left foot     Plan: Patient will start Dial soap cleansing dry dressing change continue antibiotics crutches for pressure offloading.  Follow-Up Instructions: Return in about 2 weeks (around 06/20/2023).   Ortho Exam  Patient is alert, oriented, no adenopathy, well-dressed, normal affect, normal respiratory effort. Examination the wound is well-approximated there is no cellulitis there is clear serosanguineous drainage.  Patient is currently on Duricef.  Cultures were positive for Staph aureus pansensitive.  Imaging: No results found. No images are attached to the encounter.  Labs: Lab Results  Component Value Date   HGBA1C 4.9 05/30/2023   HGBA1C 4.7 (L) 10/03/2021   REPTSTATUS 06/05/2023 FINAL 05/31/2023   GRAMSTAIN  05/31/2023    ABUNDANT WBC PRESENT, PREDOMINANTLY PMN RARE GRAM POSITIVE COCCI    CULT  05/31/2023    MODERATE STAPHYLOCOCCUS AUREUS NO ANAEROBES ISOLATED Performed at [facility], [address], [place], [place] [address]    [facility] STAPHYLOCOCCUS AUREUS 05/31/2023     Lab Results  Component Value Date   ALBUMIN 4.1 12/07/2021   ALBUMIN 4.2 10/04/2021   ALBUMIN 3.9 10/03/2021    Lab Results  Component Value Date   MG 1.7 10/03/2021   No results found for: "VD25OH"  No results found for: "PREALBUMIN"    Latest Ref Rng & Units 05/30/2023   10:43 AM 10/03/2021     4:55 PM  CBC EXTENDED  WBC 3.8 - 10.8 Thousand/uL 9.5  6.4   RBC 3.80 - 5.10 Million/uL 3.91  4.65   Hemoglobin 11.7 - 15.5 g/dL 19.1  47.8   HCT 29.5 - 45.0 % 37.9  44.7   Platelets 140 - 400 Thousand/uL 252  264   NEUT# 1,500 - 7,800 cells/uL 7,743  4.5   Lymph# 850 - 3,900 cells/uL 960  1.2      There is no height or weight on file to calculate BMI.  Orders:  No orders of the defined types were placed in this encounter.  No orders of the defined types were placed in this encounter.    Procedures: No procedures performed  Clinical Data: No additional findings.  ROS:  All other systems negative, except as noted in the HPI. Review of Systems  Objective: Vital Signs: LMP 05/12/2023   Specialty Comments:  No specialty comments available.  PMFS History: Patient Active Problem List   Diagnosis Date Noted   Cutaneous abscess of left foot 05/31/2023   Abscess of foot 05/31/2023   MDD (major depressive disorder), recurrent severe, without psychosis (HCC) 10/04/2021   Alcohol use disorder, severe, dependence (HCC) 10/04/2021   Cannabis abuse 10/04/2021   Past Medical History:  Diagnosis Date   Medical history non-contributory  History reviewed. No pertinent family history.  Past Surgical History:  Procedure Laterality Date   I & D EXTREMITY Left 05/31/2023   Procedure: LEFT FOOT DEBRIDEMENT;  Surgeon: [name], MD;  Location: [facility] OR;  Service: Orthopedics;  Laterality: Left;   INCISION AND DRAINAGE ABSCESS Right 12/07/2021   Procedure: INCISION AND DRAINAGE OF RIGHT BREAST ABSCESS;  Surgeon: Manus Rudd, MD;  Location: [facility] OR;  Service: General;  Laterality: Right;   Social History   Occupational History   Not on file  Tobacco Use   Smoking status: Former   Smokeless tobacco: Never  Vaping Use   Vaping status: Every Day   Substances: Nicotine, Flavoring  Substance and Sexual Activity   Alcohol use: Yes    Comment: 05/30/23- none in past week- before  that 1 pint over 3 days   Drug use: Yes    Types: Marijuana   Sexual activity: Not on file

## 2023-06-11 ENCOUNTER — Telehealth: Payer: Self-pay | Admitting: Orthopedic Surgery

## 2023-06-11 NOTE — Telephone Encounter (Signed)
Received $25.00 cash, medical records release form and disability paperwork from patient/Forwarding to [place] today

## 2023-06-20 ENCOUNTER — Ambulatory Visit (INDEPENDENT_AMBULATORY_CARE_PROVIDER_SITE_OTHER): Payer: PRIVATE HEALTH INSURANCE | Admitting: Orthopedic Surgery

## 2023-06-20 DIAGNOSIS — M79672 Pain in left foot: Secondary | ICD-10-CM

## 2023-07-01 ENCOUNTER — Encounter: Payer: Self-pay | Admitting: Orthopedic Surgery

## 2023-07-01 NOTE — Progress Notes (Signed)
Office Visit Note   Patient: Ashley Bautista           Date of Birth: December 10, 1985           MRN: [id number] Visit Date: 06/20/2023              Requested by: No referring provider defined for this encounter. PCP: Pcp, No  Chief Complaint  Patient presents with   Left Foot - Routine Post Op    05/31/2023 left foot debridement       HPI: Patient is a 37 year old woman who is 2 weeks status post debridement abscess left foot on August 2.  Patient finished her antibiotics last week she is on a crutch for ambulation.  Assessment & Plan: Visit Diagnoses:  1. Pain in left foot     Plan: Sutures harvested today begin Dial soap cleansing continue protected weightbearing.  Follow-Up Instructions: Return in about 2 weeks (around 07/04/2023).   Ortho Exam  Patient is alert, oriented, no adenopathy, well-dressed, normal affect, normal respiratory effort. Examination the foot is well-healed there is no cellulitis or drainage.  Imaging: No results found. No images are attached to the encounter.  Labs: Lab Results  Component Value Date   HGBA1C 4.9 05/30/2023   HGBA1C 4.7 (L) 10/03/2021   REPTSTATUS 06/05/2023 FINAL 05/31/2023   GRAMSTAIN  05/31/2023    ABUNDANT WBC PRESENT, PREDOMINANTLY PMN RARE GRAM POSITIVE COCCI    CULT  05/31/2023    MODERATE STAPHYLOCOCCUS AUREUS NO ANAEROBES ISOLATED Performed at [facility], [address], [place], [place] [address]    [facility] STAPHYLOCOCCUS AUREUS 05/31/2023     Lab Results  Component Value Date   ALBUMIN 4.1 12/07/2021   ALBUMIN 4.2 10/04/2021   ALBUMIN 3.9 10/03/2021    Lab Results  Component Value Date   MG 1.7 10/03/2021   No results found for: "VD25OH"  No results found for: "PREALBUMIN"    Latest Ref Rng & Units 05/30/2023   10:43 AM 10/03/2021    4:55 PM  CBC EXTENDED  WBC 3.8 - 10.8 Thousand/uL 9.5  6.4   RBC 3.80 - 5.10 Million/uL 3.91  4.65   Hemoglobin 11.7 - 15.5 g/dL 19.1  47.8   HCT 29.5 - 45.0 %  37.9  44.7   Platelets 140 - 400 Thousand/uL 252  264   NEUT# 1,500 - 7,800 cells/uL 7,743  4.5   Lymph# 850 - 3,900 cells/uL 960  1.2      There is no height or weight on file to calculate BMI.  Orders:  No orders of the defined types were placed in this encounter.  No orders of the defined types were placed in this encounter.    Procedures: No procedures performed  Clinical Data: No additional findings.  ROS:  All other systems negative, except as noted in the HPI. Review of Systems  Objective: Vital Signs: LMP 05/12/2023   Specialty Comments:  No specialty comments available.  PMFS History: Patient Active Problem List   Diagnosis Date Noted   Cutaneous abscess of left foot 05/31/2023   Abscess of foot 05/31/2023   MDD (major depressive disorder), recurrent severe, without psychosis (HCC) 10/04/2021   Alcohol use disorder, severe, dependence (HCC) 10/04/2021   Cannabis abuse 10/04/2021   Past Medical History:  Diagnosis Date   Medical history non-contributory     History reviewed. No pertinent family history.  Past Surgical History:  Procedure Laterality Date   I & D EXTREMITY Left 05/31/2023  Procedure: LEFT FOOT DEBRIDEMENT;  Surgeon: [name], MD;  Location: [facility] OR;  Service: Orthopedics;  Laterality: Left;   INCISION AND DRAINAGE ABSCESS Right 12/07/2021   Procedure: INCISION AND DRAINAGE OF RIGHT BREAST ABSCESS;  Surgeon: Manus Rudd, MD;  Location: [facility] OR;  Service: General;  Laterality: Right;   Social History   Occupational History   Not on file  Tobacco Use   Smoking status: Former   Smokeless tobacco: Never  Vaping Use   Vaping status: Every Day   Substances: Nicotine, Flavoring  Substance and Sexual Activity   Alcohol use: Yes    Comment: 05/30/23- none in past week- before that 1 pint over 3 days   Drug use: Yes    Types: Marijuana   Sexual activity: Not on file

## 2023-07-05 ENCOUNTER — Ambulatory Visit (INDEPENDENT_AMBULATORY_CARE_PROVIDER_SITE_OTHER): Payer: PRIVATE HEALTH INSURANCE | Admitting: Family

## 2023-07-05 ENCOUNTER — Encounter: Payer: Self-pay | Admitting: Family

## 2023-07-05 DIAGNOSIS — L02619 Cutaneous abscess of unspecified foot: Secondary | ICD-10-CM

## 2023-07-05 NOTE — Progress Notes (Signed)
Office Visit Note   Patient: Ashley Bautista           Date of Birth: 10-May-1986           MRN: [id number] Visit Date: 07/05/2023              Requested by: No referring provider defined for this encounter. PCP: Pcp, No  Chief Complaint  Patient presents with   Left Foot - Routine Post Op    05/31/2023 left foot debridmeent      HPI: Patient is a 37 year old woman who is 2 weeks status post debridement abscess left foot on August 2.  Has been having some pain with ambulation she is in a postop shoe she feels like she is stepping on a rock.  She is also noticed a retained suture  Assessment & Plan: Visit Diagnoses:  No diagnosis found.   Plan: Suture harvested today.  Debrided of hyperkeratotic tissue.  May advance her weightbearing as tolerated in regular shoewear.   Follow-Up Instructions: No follow-ups on file.   Ortho Exam  Patient is alert, oriented, no adenopathy, well-dressed, normal affect, normal respiratory effort. Examination the foot is well-healed there is no cellulitis or drainage.  There is 1 retained suture to the plantar aspect of her foot with some surrounding hyperkeratotic tissue this was debrided with a 10 blade knife after informed consent back to viable tissue.  Imaging: No results found. No images are attached to the encounter.  Labs: Lab Results  Component Value Date   HGBA1C 4.9 05/30/2023   HGBA1C 4.7 (L) 10/03/2021   REPTSTATUS 06/05/2023 FINAL 05/31/2023   GRAMSTAIN  05/31/2023    ABUNDANT WBC PRESENT, PREDOMINANTLY PMN RARE GRAM POSITIVE COCCI    CULT  05/31/2023    MODERATE STAPHYLOCOCCUS AUREUS NO ANAEROBES ISOLATED Performed at [facility], [address] [place], [place] [address]    [facility] STAPHYLOCOCCUS AUREUS 05/31/2023     Lab Results  Component Value Date   ALBUMIN 4.1 12/07/2021   ALBUMIN 4.2 10/04/2021   ALBUMIN 3.9 10/03/2021    Lab Results  Component Value Date   MG 1.7 10/03/2021   No results found  for: "VD25OH"  No results found for: "PREALBUMIN"    Latest Ref Rng & Units 05/30/2023   10:43 AM 10/03/2021    4:55 PM  CBC EXTENDED  WBC 3.8 - 10.8 Thousand/uL 9.5  6.4   RBC 3.80 - 5.10 Million/uL 3.91  4.65   Hemoglobin 11.7 - 15.5 g/dL 19.1  47.8   HCT 29.5 - 45.0 % 37.9  44.7   Platelets 140 - 400 Thousand/uL 252  264   NEUT# 1,500 - 7,800 cells/uL 7,743  4.5   Lymph# 850 - 3,900 cells/uL 960  1.2      There is no height or weight on file to calculate BMI.  Orders:  No orders of the defined types were placed in this encounter.  No orders of the defined types were placed in this encounter.    Procedures: No procedures performed  Clinical Data: No additional findings.  ROS:  All other systems negative, except as noted in the HPI. Review of Systems  Objective: Vital Signs: There were no vitals taken for this visit.  Specialty Comments:  No specialty comments available.  PMFS History: Patient Active Problem List   Diagnosis Date Noted   Cutaneous abscess of left foot 05/31/2023   Abscess of foot 05/31/2023   MDD (major depressive disorder), recurrent severe, without psychosis (  HCC) 10/04/2021   Alcohol use disorder, severe, dependence (HCC) 10/04/2021   Cannabis abuse 10/04/2021   Past Medical History:  Diagnosis Date   Medical history non-contributory     History reviewed. No pertinent family history.  Past Surgical History:  Procedure Laterality Date   I & D EXTREMITY Left 05/31/2023   Procedure: LEFT FOOT DEBRIDEMENT;  Surgeon: [name], MD;  Location[facility] OR;  Service: Orthopedics;  Laterality: Left;   INCISION AND DRAINAGE ABSCESS Right 12/07/2021   Procedure: INCISION AND DRAINAGE OF RIGHT BREAST ABSCESS;  Surgeon: Manus Rudd, MD;  Location: [facility] OR;  Service: General;  Laterality: Right;   Social History   Occupational History   Not on file  Tobacco Use   Smoking status: Former   Smokeless tobacco: Never  Vaping Use   Vaping status:  Every Day   Substances: Nicotine, Flavoring  Substance and Sexual Activity   Alcohol use: Yes    Comment: 05/30/23- none in past week- before that 1 pint over 3 days   Drug use: Yes    Types: Marijuana   Sexual activity: Not on file

## 2023-07-13 IMAGING — MG DIGITAL DIAGNOSTIC BILAT W/ TOMO W/ CAD
8 series · 8 of 24 positions shown · non-contrast
Comparison: None.

CLINICAL DATA: Patient presents with a 2 week history of right
breast pain, swelling and redness.

EXAM:
DIGITAL DIAGNOSTIC BILATERAL MAMMOGRAM WITH TOMOSYNTHESIS AND CAD;
ULTRASOUND RIGHT BREAST LIMITED
TECHNIQUE: Bilateral digital diagnostic mammography and breast tomosynthesis
was performed. The images were evaluated with computer-aided
detection.; Targeted ultrasound examination of the right breast was
performed

[R CC synth-2D]
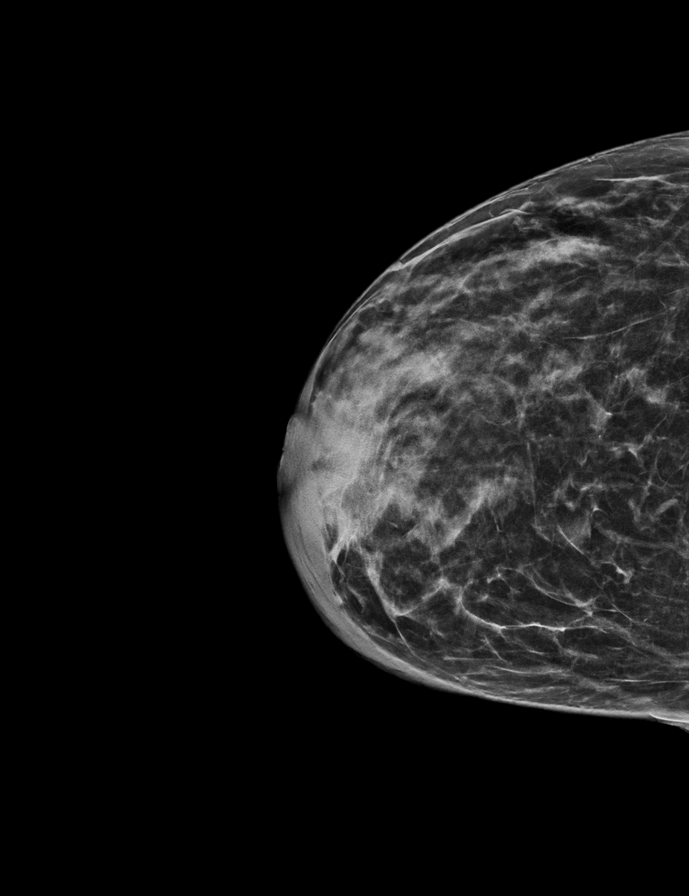

[R MLO synth-2D]
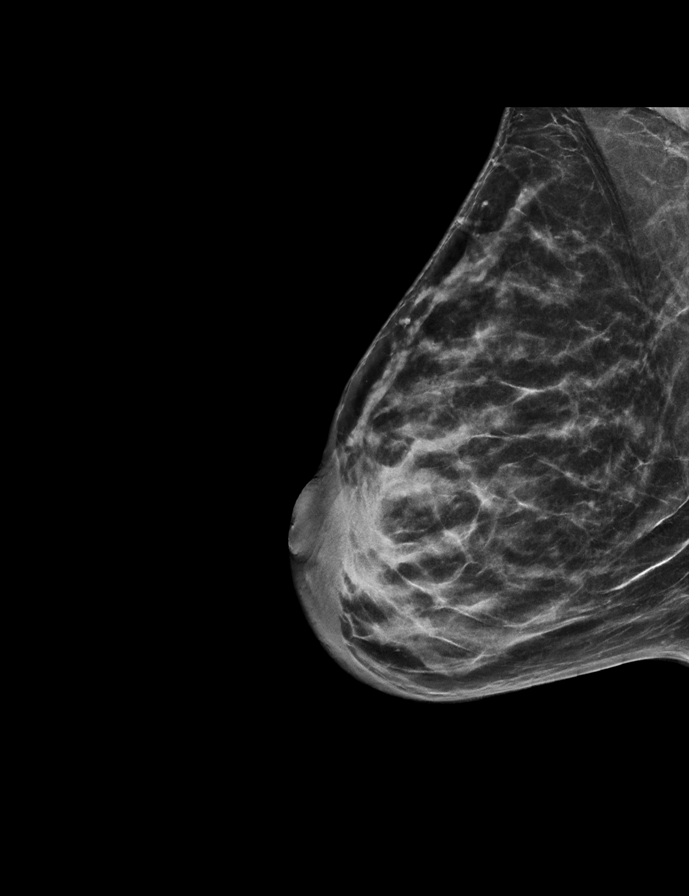

[L CC synth-2D]
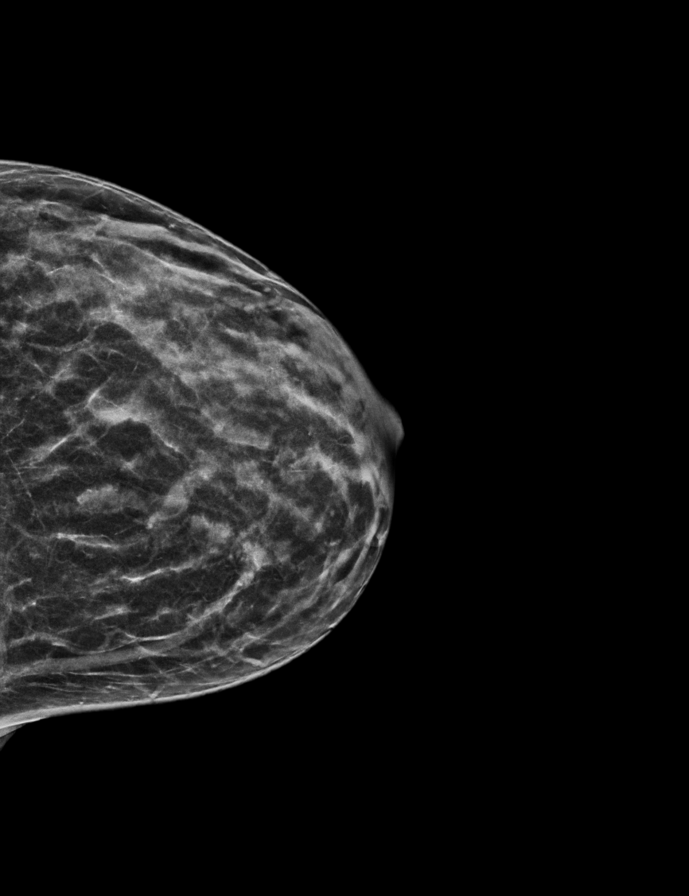

[L MLO synth-2D]
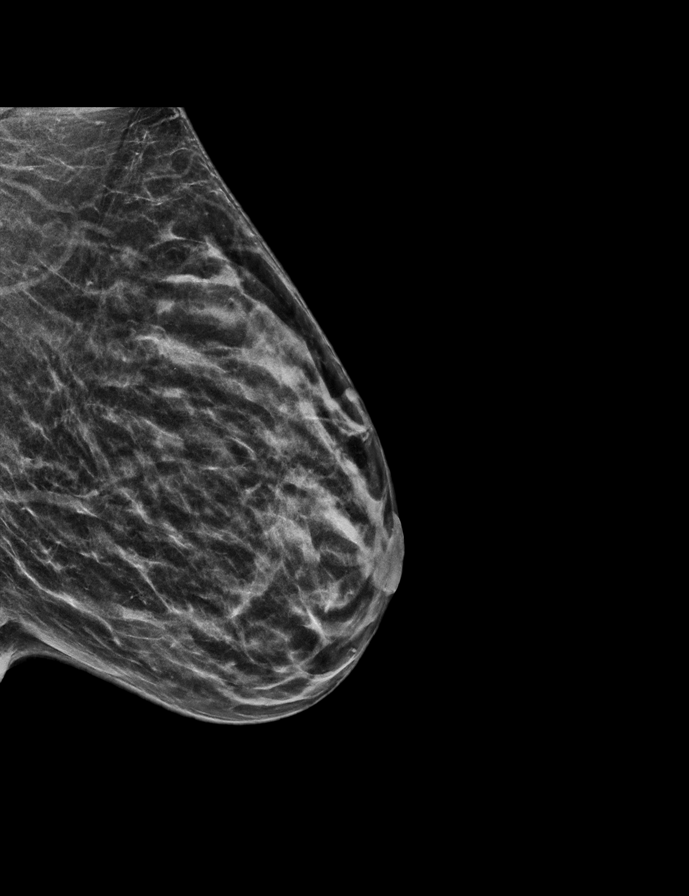

[R MLO tomo · tomo slice 29/58.0]
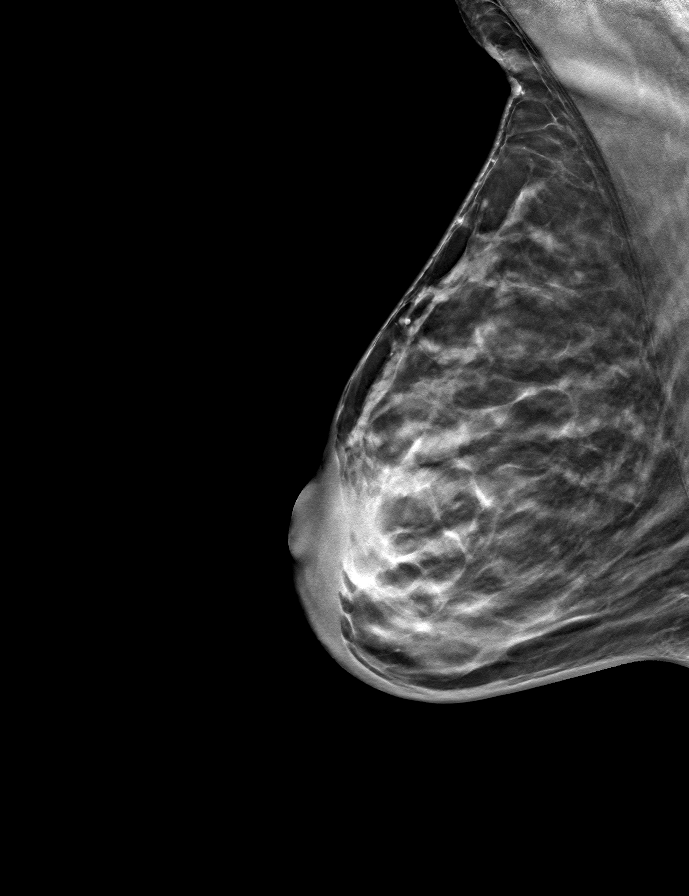

[R CC tomo · tomo slice 26/51.0]
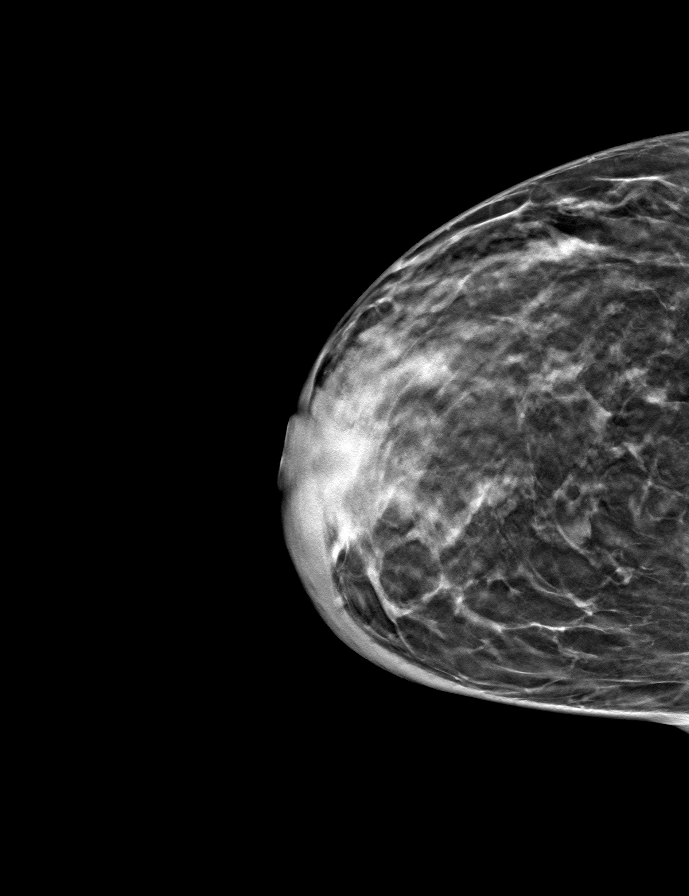

[L CC tomo · tomo slice 22/43.0]
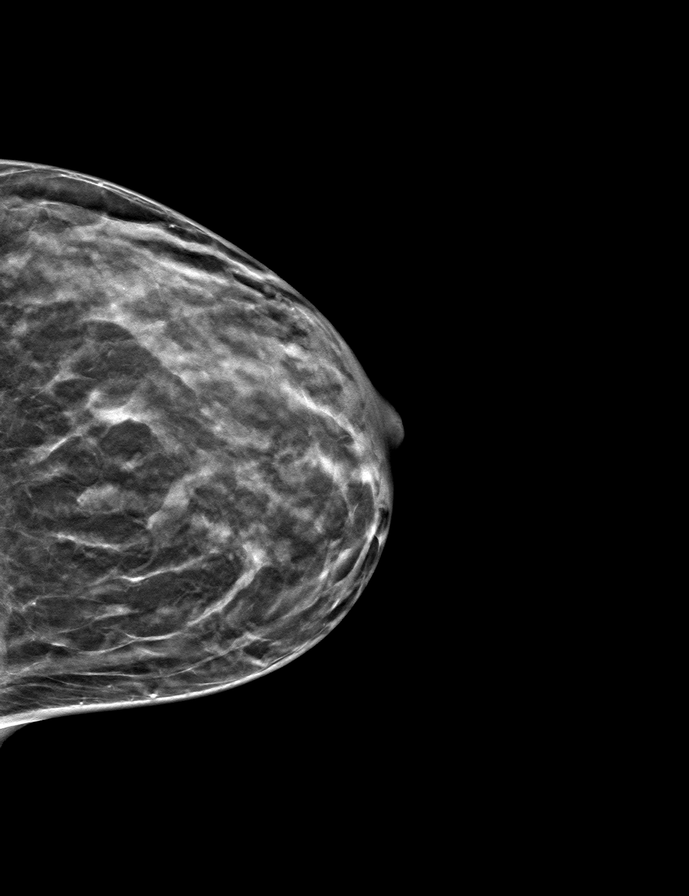

[L MLO tomo · tomo slice 24/47.0]
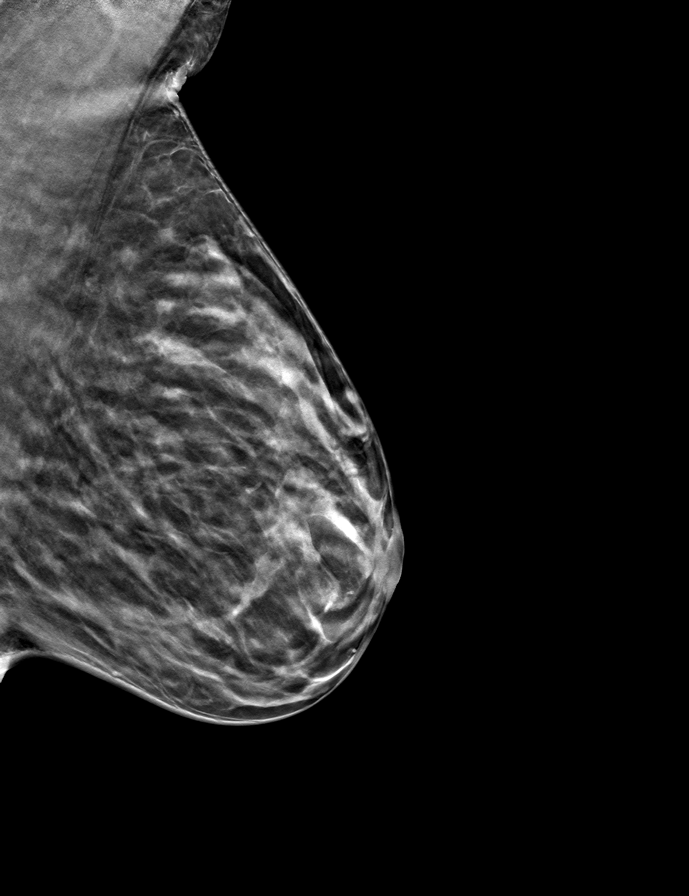

[8 of 24 positions shown; findings below may reference images not displayed]

ACR Breast Density Category c: The breast tissue is heterogeneously
dense, which may obscure small masses.
FINDINGS: On the right, there is periareolar/anterior skin thickening with
underlying increased density and trabecular thickening consistent
with mastitis. No defined mass. There are no areas of architectural
distortion. No suspicious calcifications. No left breast
abnormality.

On physical exam, there is periareolar swelling, skin thickening and
erythema centered just medial to the nipple, associated with
significant tenderness.

Targeted ultrasound is performed, showing a heterogeneous hypoechoic
area with a more well-defined complex fluid collection, centered on
the 3 o'clock retroareolar region, associated with increased blood
flow. The more defined collection measures approximately 1.5 cm. The
overall abnormality measures approximately 4.5 by 2.0 x 4.0 cm.
IMPRESSION: 1. No evidence of breast malignancy.
2. Right breast mastitis with evidence of an abscess.

RECOMMENDATION:
1. Ultrasound-guided aspiration the retroareolar right breast
abscess.
2. Patient will also be placed on antibiotics, Augmentin, 875 mg, 1
p.o. b.i.d. for 2 weeks.
3. Follow-up right breast ultrasound in 2 weeks to assess for
improvement/resolution.

I have discussed the findings and recommendations with the patient.
If applicable, a reminder letter will be sent to the patient
regarding the next appointment.

BI-RADS CATEGORY  2: Benign.

## 2023-07-13 IMAGING — US US BREAST*R* LIMITED INC AXILLA
1 series · 5 of 5 positions shown · non-contrast
Comparison: None.

CLINICAL DATA: Patient presents with a 2 week history of right
breast pain, swelling and redness.

EXAM:
DIGITAL DIAGNOSTIC BILATERAL MAMMOGRAM WITH TOMOSYNTHESIS AND CAD;
ULTRASOUND RIGHT BREAST LIMITED
TECHNIQUE: Bilateral digital diagnostic mammography and breast tomosynthesis
was performed. The images were evaluated with computer-aided
detection.; Targeted ultrasound examination of the right breast was
performed

[Series 1: us breast*right* limited inc axilla · 0.06mm/px · 5 of 5 slices shown]
[im 1/5]
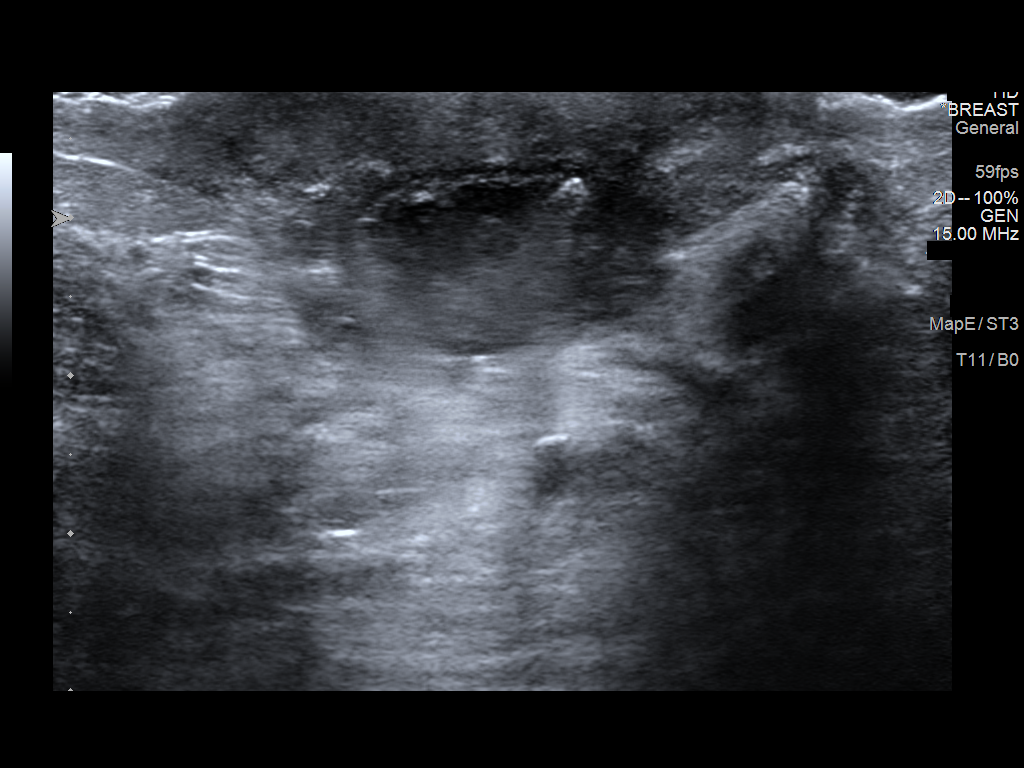
[im 2/5]
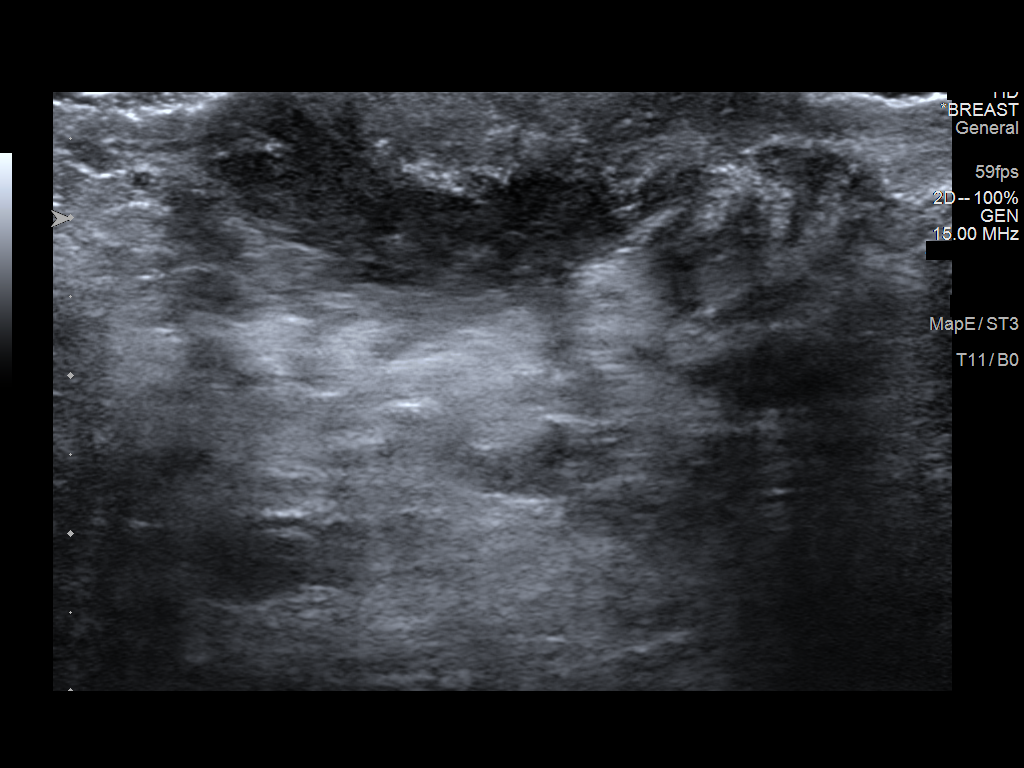
[im 3/5]
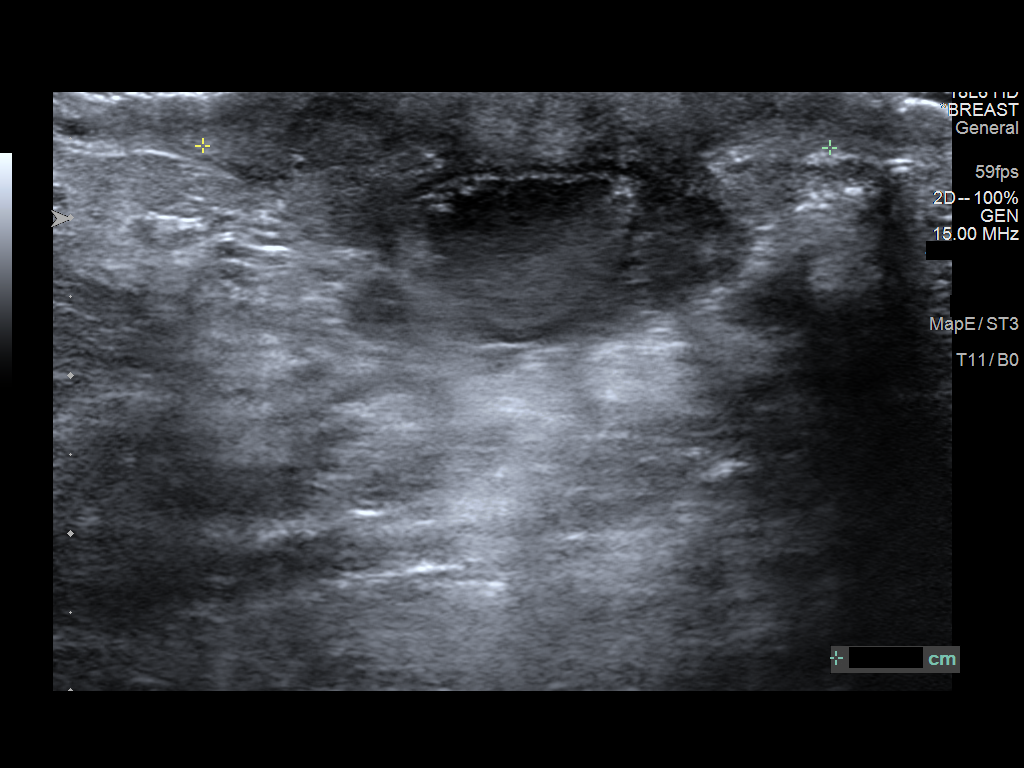
[im 4/5]
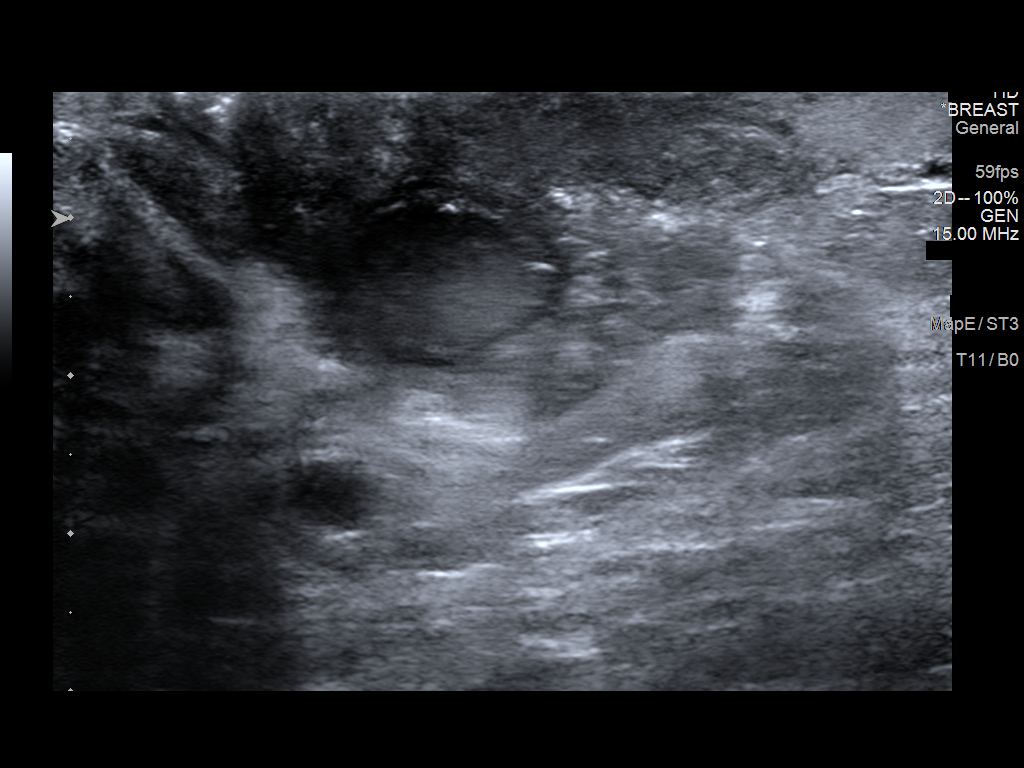
[im 5/5]
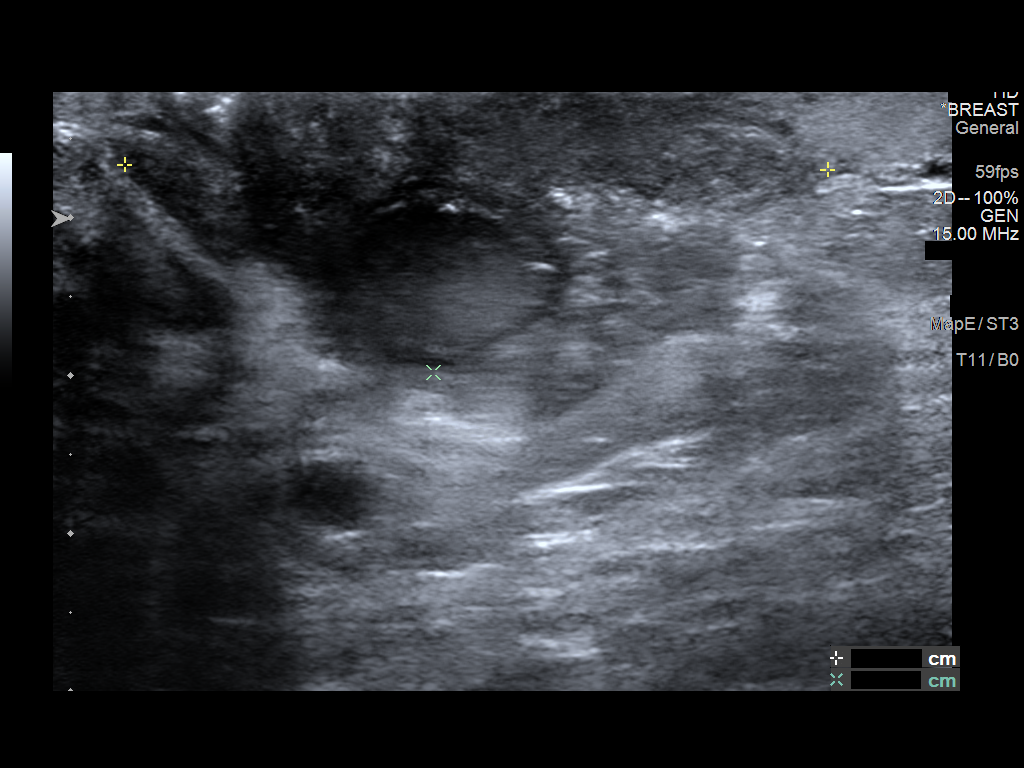

[5 of 5 positions shown; findings below may reference images not displayed]

ACR Breast Density Category c: The breast tissue is heterogeneously
dense, which may obscure small masses.
FINDINGS: On the right, there is periareolar/anterior skin thickening with
underlying increased density and trabecular thickening consistent
with mastitis. No defined mass. There are no areas of architectural
distortion. No suspicious calcifications. No left breast
abnormality.

On physical exam, there is periareolar swelling, skin thickening and
erythema centered just medial to the nipple, associated with
significant tenderness.

Targeted ultrasound is performed, showing a heterogeneous hypoechoic
area with a more well-defined complex fluid collection, centered on
the 3 o'clock retroareolar region, associated with increased blood
flow. The more defined collection measures approximately 1.5 cm. The
overall abnormality measures approximately 4.5 by 2.0 x 4.0 cm.
IMPRESSION: 1. No evidence of breast malignancy.
2. Right breast mastitis with evidence of an abscess.

RECOMMENDATION:
1. Ultrasound-guided aspiration the retroareolar right breast
abscess.
2. Patient will also be placed on antibiotics, Augmentin, 875 mg, 1
p.o. b.i.d. for 2 weeks.
3. Follow-up right breast ultrasound in 2 weeks to assess for
improvement/resolution.

I have discussed the findings and recommendations with the patient.
If applicable, a reminder letter will be sent to the patient
regarding the next appointment.

BI-RADS CATEGORY  2: Benign.

## 2023-08-15 IMAGING — US US BREAST*R* LIMITED INC AXILLA
1 series · 5 of 5 positions shown · non-contrast
Comparison: 05/31/2021, 05/12/2021

CLINICAL DATA: Patient returns for evaluation of the RIGHT breast.
Patient has completed 2 rounds of antibiotics and reports
significant improvement.

EXAM:
ULTRASOUND OF THE RIGHT BREAST

[Series 1: us breast*right* limited inc axilla · 0.05mm/px · 5 of 5 slices shown]
[im 1/5]
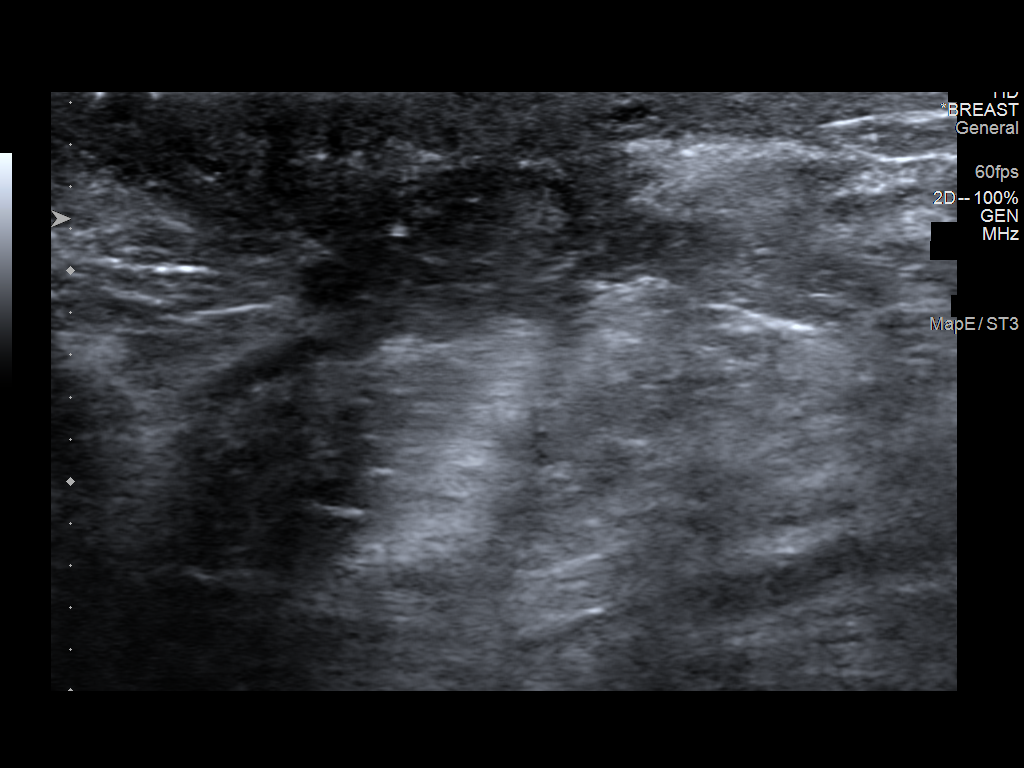
[im 2/5]
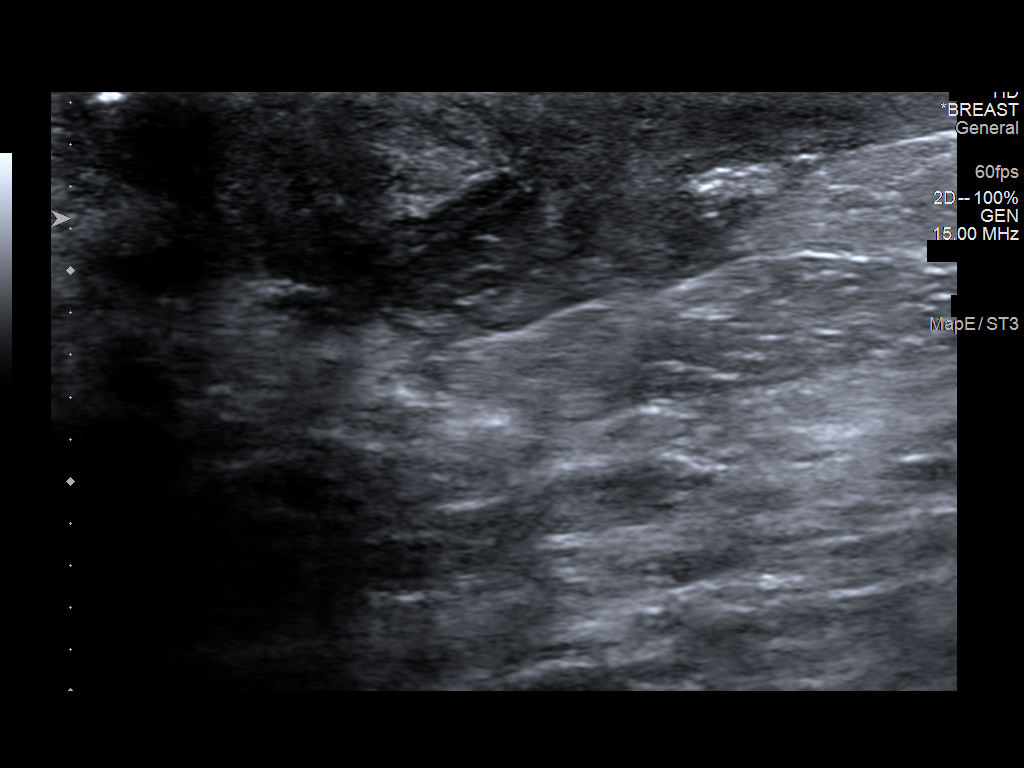
[im 3/5]
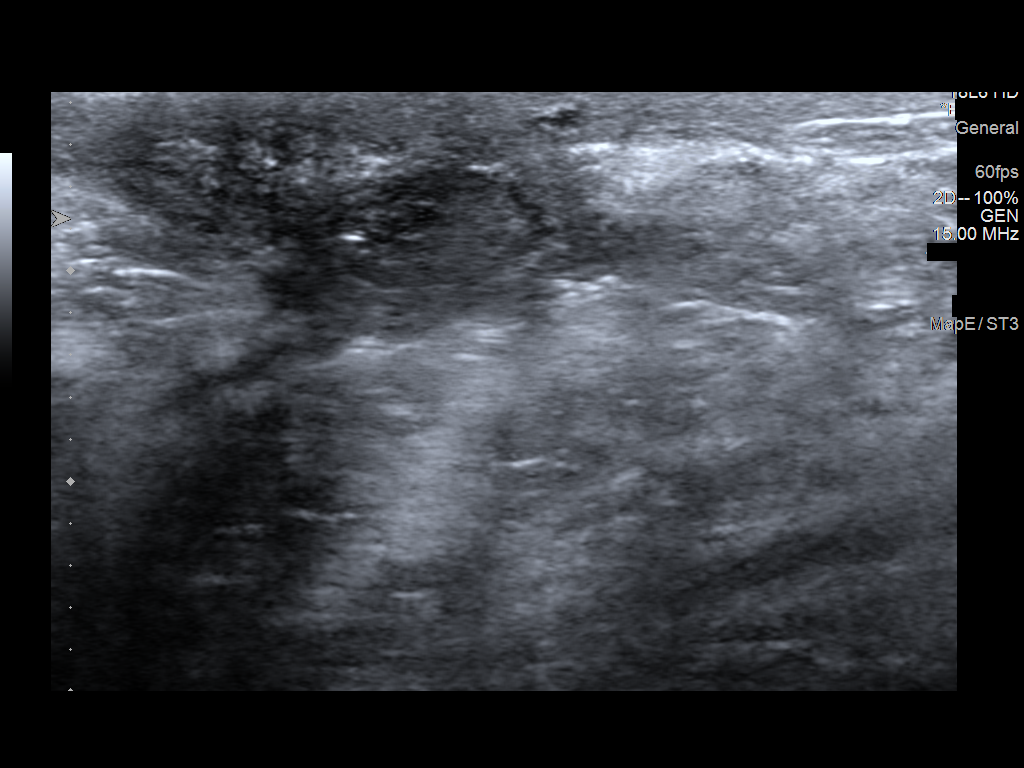
[im 4/5]
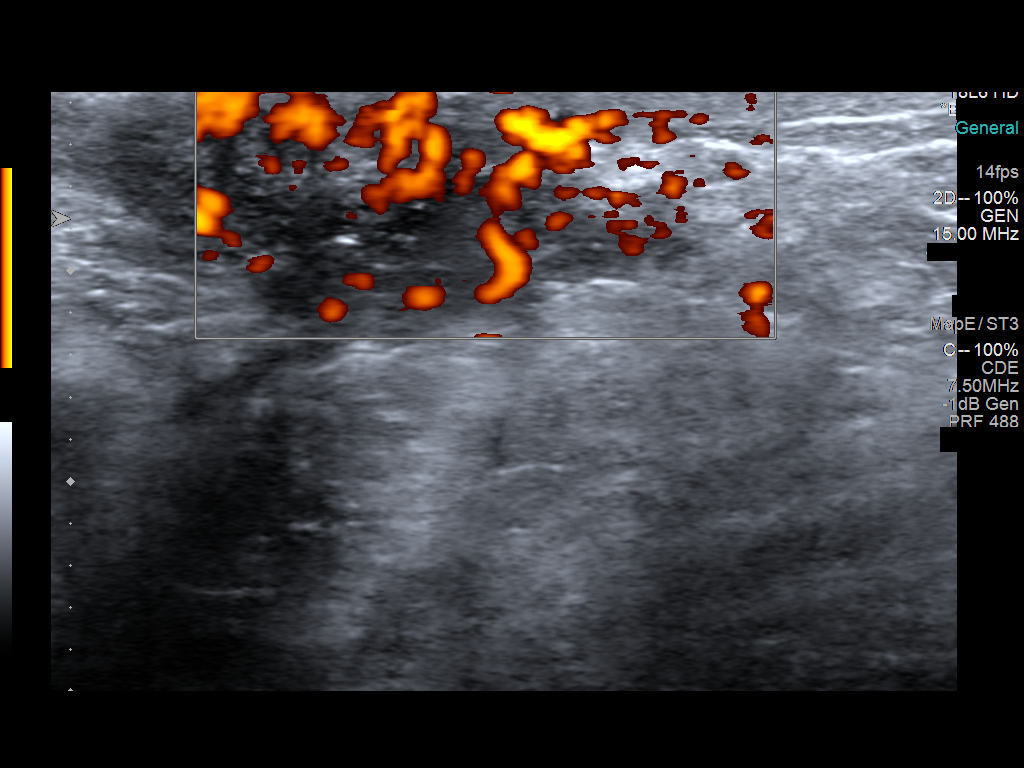
[im 5/5]
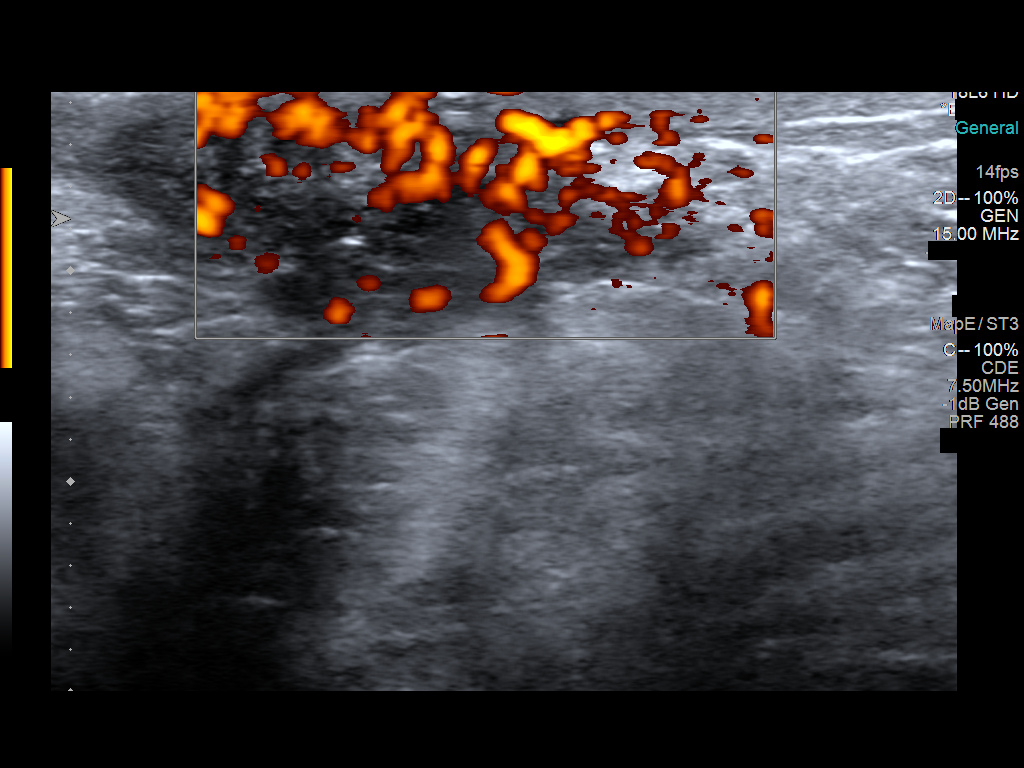

[5 of 5 positions shown; findings below may reference images not displayed]

FINDINGS: On physical exam, I palpate minimal thickening in the MEDIAL aspect
of the RIGHT breast near the nipple. There is faint erythema in the
MEDIAL aspect of the RIGHT breast, significantly improved per
patient's report.

Targeted ultrasound is performed, showing residual focal thickening
in the 3 o'clock retroareolar region of the RIGHT breast. A
previously noted circumscribed fluid collection is now hypoechoic
and organized. No drainable fluid collection identified.
IMPRESSION: Significantly improved RIGHT breast abscess. There is minimal
persistent inflammation.

RECOMMENDATION:
Recommend clinical follow-up as needed.

Recommend ultrasound if symptoms worsen or mass recurs.

Screening mammogram at age 39 unless there are persistent or
intervening clinical concerns. (Code:[id number]-8Q7)

I have discussed the findings and recommendations with the patient.
If applicable, a reminder letter will be sent to the patient
regarding the next appointment.

BI-RADS CATEGORY  2: Benign.

## 2023-08-29 ENCOUNTER — Other Ambulatory Visit: Payer: Self-pay

## 2023-08-29 ENCOUNTER — Telehealth: Payer: Self-pay | Admitting: Orthopedic Surgery

## 2023-08-29 NOTE — Telephone Encounter (Signed)
Called pt and advised that letter is ready for pick up at the front desk

## 2023-08-29 NOTE — Telephone Encounter (Signed)
Pt needs a return to work note stating she is okay to return to work 08/30/23 per her disability paperwork, (I double checked the dates for you). Please advise

## 2023-08-30 ENCOUNTER — Telehealth: Payer: Self-pay | Admitting: Orthopedic Surgery

## 2023-08-30 MED ORDER — METHOCARBAMOL 500 MG PO TABS: 500.0000 mg | ORAL_TABLET | Freq: Three times a day (TID) | ORAL | 0 refills | Status: AC | PRN

## 2023-08-30 NOTE — Telephone Encounter (Signed)
Pt would like to know if there is a non drowsy muscle relaxer she could use being she is starting back work and the foot she had surgery on cramps up being she has to stand on it at work please advise if there is something she can do

## 2023-08-30 NOTE — Addendum Note (Signed)
Addended by: Barnie Del R on: 08/30/2023 03:19 PM   Modules accepted: Orders
